# Patient Record
Sex: Female | Born: 1955
Health system: Southern US, Community
[De-identification: ages and names within clinical notes are randomized; demographics above are authoritative.]

---

## 2005-10-21 ENCOUNTER — Ambulatory Visit: Payer: Self-pay | Admitting: Family Medicine

## 2007-01-18 ENCOUNTER — Ambulatory Visit: Payer: Self-pay | Admitting: Internal Medicine

## 2010-12-16 ENCOUNTER — Ambulatory Visit: Payer: Self-pay

## 2015-01-23 ENCOUNTER — Telehealth: Payer: Self-pay | Admitting: Internal Medicine

## 2015-01-23 NOTE — Telephone Encounter (Signed)
Dr Derrel Nip will take Mrs. Kleeman on as a new pt.. Pt will call to schedule.Marland Kitchen

## 2015-12-28 DIAGNOSIS — H52203 Unspecified astigmatism, bilateral: Secondary | ICD-10-CM | POA: Diagnosis not present

## 2016-12-11 ENCOUNTER — Ambulatory Visit (INDEPENDENT_AMBULATORY_CARE_PROVIDER_SITE_OTHER)
Admission: EM | Admit: 2016-12-11 | Discharge: 2016-12-11 | Disposition: A | Discharge status: Home / Self Care | Payer: 59 | Source: Home / Self Care | Attending: Family Medicine | Admitting: Family Medicine

## 2016-12-11 ENCOUNTER — Ambulatory Visit (INDEPENDENT_AMBULATORY_CARE_PROVIDER_SITE_OTHER)
Admit: 2016-12-11 | Discharge: 2016-12-11 | Disposition: A | Discharge status: Home / Self Care | Payer: 59 | Attending: Family Medicine | Admitting: Family Medicine

## 2016-12-11 DIAGNOSIS — Z8249 Family history of ischemic heart disease and other diseases of the circulatory system: Secondary | ICD-10-CM

## 2016-12-11 DIAGNOSIS — Z87440 Personal history of urinary (tract) infections: Secondary | ICD-10-CM

## 2016-12-11 DIAGNOSIS — C240 Malignant neoplasm of extrahepatic bile duct: Secondary | ICD-10-CM | POA: Diagnosis not present

## 2016-12-11 DIAGNOSIS — K831 Obstruction of bile duct: Secondary | ICD-10-CM | POA: Diagnosis not present

## 2016-12-11 DIAGNOSIS — F17200 Nicotine dependence, unspecified, uncomplicated: Secondary | ICD-10-CM | POA: Insufficient documentation

## 2016-12-11 DIAGNOSIS — R1110 Vomiting, unspecified: Secondary | ICD-10-CM | POA: Diagnosis not present

## 2016-12-11 DIAGNOSIS — R110 Nausea: Secondary | ICD-10-CM

## 2016-12-11 DIAGNOSIS — R300 Dysuria: Secondary | ICD-10-CM

## 2016-12-11 DIAGNOSIS — R8290 Unspecified abnormal findings in urine: Secondary | ICD-10-CM

## 2016-12-11 DIAGNOSIS — Z79899 Other long term (current) drug therapy: Secondary | ICD-10-CM

## 2016-12-11 DIAGNOSIS — C249 Malignant neoplasm of biliary tract, unspecified: Secondary | ICD-10-CM

## 2016-12-11 DIAGNOSIS — R590 Localized enlarged lymph nodes: Secondary | ICD-10-CM

## 2016-12-11 DIAGNOSIS — Z808 Family history of malignant neoplasm of other organs or systems: Secondary | ICD-10-CM

## 2016-12-11 DIAGNOSIS — R1013 Epigastric pain: Secondary | ICD-10-CM

## 2016-12-11 DIAGNOSIS — R109 Unspecified abdominal pain: Secondary | ICD-10-CM | POA: Diagnosis not present

## 2016-12-11 DIAGNOSIS — Z833 Family history of diabetes mellitus: Secondary | ICD-10-CM | POA: Insufficient documentation

## 2016-12-11 DIAGNOSIS — Z882 Allergy status to sulfonamides status: Secondary | ICD-10-CM

## 2016-12-11 DIAGNOSIS — C221 Intrahepatic bile duct carcinoma: Secondary | ICD-10-CM

## 2016-12-11 DIAGNOSIS — R112 Nausea with vomiting, unspecified: Secondary | ICD-10-CM

## 2016-12-11 LAB — COMPREHENSIVE METABOLIC PANEL
ALBUMIN: 3.9 g/dL (ref 3.5–5.0)
ALK PHOS: 383 U/L — AB (ref 38–126)
ALT: 277 U/L — AB (ref 14–54)
AST: 189 U/L — AB (ref 15–41)
Anion gap: 8 (ref 5–15)
BUN: 11 mg/dL (ref 6–20)
CALCIUM: 9.1 mg/dL (ref 8.9–10.3)
CHLORIDE: 106 mmol/L (ref 101–111)
CO2: 25 mmol/L (ref 22–32)
CREATININE: 0.53 mg/dL (ref 0.44–1.00)
GFR calc Af Amer: >60 mL/min (ref >60)
GFR calc non Af Amer: >60 mL/min (ref >60)
Glucose, Bld: 94 mg/dL (ref 65–99)
Potassium: 4.6 mmol/L (ref 3.5–5.1)
SODIUM: 139 mmol/L (ref 135–145)
Total Bilirubin: 7.8 mg/dL — ABNORMAL HIGH (ref 0.3–1.2)
Total Protein: 7.6 g/dL (ref 6.5–8.1)

## 2016-12-11 LAB — LIPASE, BLOOD: LIPASE: 39 U/L (ref 11–51)

## 2016-12-11 LAB — CBC WITH DIFFERENTIAL/PLATELET
BASOS ABS: 0.2 10*3/uL — AB (ref 0–0.1)
Basophils Relative: 2 %
EOS ABS: 0.2 10*3/uL (ref 0–0.7)
Eosinophils Relative: 2 %
HCT: 38.2 % (ref 35.0–47.0)
HEMOGLOBIN: 13.1 g/dL (ref 12.0–16.0)
LYMPHS ABS: 2.8 10*3/uL (ref 1.0–3.6)
Lymphocytes Relative: 35 %
MCH: 31.7 pg (ref 26.0–34.0)
MCHC: 34.4 g/dL (ref 32.0–36.0)
MCV: 92.2 fL (ref 80.0–100.0)
MONO ABS: 0.7 10*3/uL (ref 0.2–0.9)
Monocytes Relative: 9 %
NEUTROS PCT: 52 %
Neutro Abs: 4 10*3/uL (ref 1.4–6.5)
PLATELETS: 317 10*3/uL (ref 150–440)
RBC: 4.15 MIL/uL (ref 3.80–5.20)
RDW: 15.1 % — ABNORMAL HIGH (ref 11.5–14.5)
WBC: 7.9 10*3/uL (ref 3.6–11.0)

## 2016-12-11 LAB — URINALYSIS, COMPLETE (UACMP) WITH MICROSCOPIC
Glucose, UA: NEGATIVE mg/dL
HGB URINE DIPSTICK: NEGATIVE
Ketones, ur: NEGATIVE mg/dL
Leukocytes, UA: NEGATIVE
NITRITE: NEGATIVE
PROTEIN: NEGATIVE mg/dL
SPECIFIC GRAVITY, URINE: 1.01 (ref 1.005–1.030)
pH: 6.5 (ref 5.0–8.0)

## 2016-12-11 LAB — GAMMA GT: GGT: 293 U/L — ABNORMAL HIGH (ref 7–50)

## 2016-12-11 MED ORDER — IOPAMIDOL (ISOVUE-300) INJECTION 61%
50.0000 mL | Freq: Once | INTRAVENOUS | Status: AC | PRN
  Administered 2016-12-11: 100 mL via INTRAVENOUS

## 2016-12-11 MED ORDER — IOPAMIDOL (ISOVUE-300) INJECTION 61%: 100.0000 mL | Freq: Once | INTRAVENOUS | Status: DC | PRN

## 2016-12-11 NOTE — ED Provider Notes (Addendum)
[facility]    CSN: [id number] Arrival date & time: 12/11/16  1856  History   Chief Complaint Chief Complaint  Patient presents with  . Recurrent UTI  . Abdominal Pain   HPI  61 year old female presents with the above complaints.  Patient reports a 4-day history of intermittent dysuria.  She reports that her urine is dark.  No reports of frequency or urgency.  No fever.  She has been taking some cranberry supplement without improvement.  She currently has no dysuria.  She states that she is most troubled by the dark color of her urine.  Additionally, patient has had ongoing GI issues for the past 2 weeks.  She reports epigastric pain and associated belching and burping.  She states that she has never had this before.  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  She states that she has had no improvement.  In addition to her symptoms above, when inquired about her eating she endorsed early satiety and associated nausea.  She has had one episode of emesis.  No changes in her diet.  No other associated symptoms.  No other complaints at this time.  PMH - Non per report. She is a current everyday smoker.  Surgical history - D&C.  OB History    No data available     Home Medications    Prior to Admission medications   Medication Sig Start Date End Date Taking? Authorizing Provider  dexlansoprazole (DEXILANT) 60 MG capsule Take 60 mg by mouth daily.   Yes [provider]    Family History Family History  Problem Relation Age of Onset  . Hypertension Mother   . Thyroid disease Mother   . Diabetes Mother   . Heart failure Mother   . Cancer Father        bone   Social History Social History   Tobacco Use  . Smoking status: Current Every Day Smoker  . Smokeless tobacco: Current User  Substance Use Topics  . Alcohol use: Yes  . Drug use: No    Allergies   Sulfa antibiotics   Review of Systems Review of  Systems  Constitutional: Negative for fever.  Gastrointestinal: Positive for abdominal pain.       Nausea, vomiting, belching, early satiety.  Genitourinary:       Intermittent dysuria.  Dark colored urine.  All other systems reviewed and are negative.  Physical Exam Triage Vital Signs ED Triage Vitals  Enc Vitals Group     BP 12/11/16 0856 129/81     Pulse Rate 12/11/16 0856 76     Resp 12/11/16 0856 16     Temp 12/11/16 0856 98 F (36.7 C)     Temp Source 12/11/16 0856 Oral     SpO2 12/11/16 0856 98 %     Weight 12/11/16 0854 124 lb (56.2 kg)     Height 12/11/16 0854 5' (1.524 m)     Head Circumference --      Peak Flow --      Pain Score 12/11/16 0853 5     Pain Loc --      Pain Edu? --      Excl. in [place]? --     Updated Vital Signs BP 129/81 (BP Location: Left Arm)   Pulse 76   Temp 98 F (36.7 C) (Oral)   Resp 16   Ht 5' (1.524 m)   Wt 124 lb (56.2 kg)  SpO2 98%   BMI 24.22 kg/m   Physical Exam  Constitutional: She is oriented to person, place, and time. She appears well-developed. No distress.  HENT:  Head: Normocephalic and atraumatic.  Nose: Nose normal.  Eyes: Pupils are equal, round, and reactive to light.  Scleral icterus noted.  Cardiovascular: Normal rate and regular rhythm.  No murmur heard. Pulmonary/Chest: Effort normal and breath sounds normal. She has no rales.  Abdominal: Soft. She exhibits no distension.  Tenderness to palpation in the epigastric region.  Also tenderness in the region directly above the umbilicus.  No palpable mass on exam.    Neurological: She is alert and oriented to person, place, and time. She exhibits normal muscle tone.  Skin: Skin is warm. No rash noted.  Psychiatric: She has a normal mood and affect. Her behavior is normal.  Vitals reviewed.  UC Treatments / Results  Labs (all labs ordered are listed, but only abnormal results are displayed) Labs Reviewed  URINALYSIS, COMPLETE (UACMP) WITH MICROSCOPIC -  Abnormal; Notable for the following components:      Result Value   Color, Urine AMBER (*)    Bilirubin Urine MODERATE (*)    Squamous Epithelial / LPF 0-5 (*)    Bacteria, UA RARE (*)    All other components within normal limits  CBC WITH DIFFERENTIAL/PLATELET - Abnormal; Notable for the following components:   RDW 15.1 (*)    Basophils Absolute 0.2 (*)    All other components within normal limits  COMPREHENSIVE METABOLIC PANEL - Abnormal; Notable for the following components:   AST 189 (*)    ALT 277 (*)    Alkaline Phosphatase 383 (*)    Total Bilirubin 7.8 (*)    All other components within normal limits  LIPASE, BLOOD  GAMMA GT    EKG  EKG Interpretation None       Radiology Ct Abdomen Pelvis W Contrast  Result Date: 12/11/2016 CLINICAL DATA:  Epigastric pain, vomiting and jaundice. EXAM: CT ABDOMEN AND PELVIS WITH CONTRAST TECHNIQUE: Multidetector CT imaging of the abdomen and pelvis was performed using the standard protocol following bolus administration of intravenous contrast. CONTRAST:  148m ISOVUE-300 IOPAMIDOL (ISOVUE-300) INJECTION 61% COMPARISON:  None. FINDINGS: Lower chest: The lung bases are clear of acute process. No pleural effusion or pulmonary lesions. The heart is normal in size. No pericardial effusion. The distal esophagus and aorta are unremarkable. Hepatobiliary: Marked intrahepatic biliary dilatation within obstructing enhancing mass involving the common hepatic duct most consistent with biliary neoplasm/Klatskin tumor. The common bile duct is normal. The gallbladder is unremarkable. No hepatic metastatic lesions. Pancreas: No mass, inflammation or ductal dilatation. Spleen: Normal size.  No focal lesions. Adrenals/Urinary Tract: The adrenal glands kidneys are normal. The bladder is unremarkable. Stomach/Bowel: The stomach, duodenum, small bowel and colon are unremarkable. Vascular/Lymphatic: Borderline enlarged upper abdominal lymph nodes. There is a 9 mm  node in the celiac axis region, a 10 mm node in the periportal region on image number 23 and a 7.5 mm node near the portal vein on image number 20. The portal and hepatic veins are patent. The aorta and branch vessels are patent. Reproductive: The uterus and ovaries are normal. Other: No pelvic mass or adenopathy. No free pelvic fluid collections. No inguinal mass or adenopathy. No abdominal wall hernia or subcutaneous lesions. Musculoskeletal: No significant bony findings. IMPRESSION: 1. 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 2.  Normal common bile duct and normal pancreas. 3. Borderline enlarged periportal lymph nodes suspicious for metastatic disease. 4. No findings suspicious for hepatic metastatic disease. 5. No other significant findings in the abdomen/pelvis. These results were called by telephone at the time of interpretation on 12/11/2016 at 11:48 am to Dr. Thersa Salt , who verbally acknowledged these results. Electronically Signed   By: Marijo Sanes M.D.   On: 12/11/2016 11:48    Procedures Procedures (including critical care time)  Medications Ordered in UC Medications - No data to display   Initial Impression / Assessment and Plan / UC Course  I have reviewed the triage vital signs and the nursing notes.  Pertinent labs & imaging results that were available during my care of the patient were reviewed by me and considered in my medical decision making (see chart for details).    60 year old female presents with epigastric pain, and reports of intermittent dysuria.  Urinalysis revealed elevated bilirubin.  Physical exam was notable for epigastric tenderness and jaundice.  Laboratory studies revealed elevated alk phos, AST and ALT as well as hyperbilirubinemia.  Laboratory studies and physical exam concerning for underlying malignancy.  CT scan of the abdomen  and pelvis was done and revealed an infiltrating biliary tumor with intrahepatic biliary dilatation.  Borderline periportal lymph node suspicious for metastatic disease.  I had a lengthy discussion with the patient throughout her visit here.  The patient will be going to see oncology.  I am trying to coordinate this at this time.  A total of 45  minutes were spent face-to-face with the patient during this encounter and over half of that time was spent discussing her clinical picture, laboratory findings, imaging, diagnosis, and treatment options.   Final Clinical Impressions(s) / UC Diagnoses   Final diagnoses:  Cancer of biliary tract [facility])    ED Discharge Orders    None     Controlled Substance Prescriptions [place] Controlled Substance Registry consulted? Not Applicable   Coral Spikes, DO 12/11/16 [place], [place], DO 12/11/16 1244

## 2016-12-11 NOTE — Discharge Instructions (Signed)
Best of luck.  Either I will call or Dr. Gary Fleet office will call.  Dr. Lacinda Axon

## 2016-12-11 NOTE — ED Triage Notes (Signed)
Onset 4 days pt has UTI Sxs and taken cranberry vitamins and has abdominal soreness and nausea onset 2 weeks but getting worst from past 2 days.

## 2016-12-12 ENCOUNTER — Inpatient Hospital Stay: Payer: 59

## 2016-12-12 ENCOUNTER — Encounter: Payer: Self-pay | Admitting: Oncology

## 2016-12-12 ENCOUNTER — Inpatient Hospital Stay
Admission: EM | Admit: 2016-12-12 | Discharge: 2016-12-14 | DRG: 445 | Disposition: A | Discharge status: Home / Self Care | Payer: 59 | Attending: Internal Medicine | Admitting: Internal Medicine

## 2016-12-12 ENCOUNTER — Other Ambulatory Visit: Payer: Self-pay

## 2016-12-12 ENCOUNTER — Inpatient Hospital Stay: Payer: 59 | Attending: Oncology | Admitting: Oncology

## 2016-12-12 ENCOUNTER — Telehealth: Payer: Self-pay

## 2016-12-12 ENCOUNTER — Encounter: Payer: Self-pay | Admitting: Emergency Medicine

## 2016-12-12 VITALS — BP 150/95 | HR 97 | Temp 98.9°F | Wt 120.9 lb

## 2016-12-12 DIAGNOSIS — K831 Obstruction of bile duct: Secondary | ICD-10-CM | POA: Diagnosis not present

## 2016-12-12 DIAGNOSIS — D134 Benign neoplasm of liver: Secondary | ICD-10-CM | POA: Insufficient documentation

## 2016-12-12 DIAGNOSIS — F419 Anxiety disorder, unspecified: Secondary | ICD-10-CM | POA: Insufficient documentation

## 2016-12-12 DIAGNOSIS — C240 Malignant neoplasm of extrahepatic bile duct: Secondary | ICD-10-CM | POA: Diagnosis present

## 2016-12-12 DIAGNOSIS — R17 Unspecified jaundice: Secondary | ICD-10-CM | POA: Insufficient documentation

## 2016-12-12 DIAGNOSIS — Z79899 Other long term (current) drug therapy: Secondary | ICD-10-CM | POA: Diagnosis not present

## 2016-12-12 DIAGNOSIS — Z808 Family history of malignant neoplasm of other organs or systems: Secondary | ICD-10-CM | POA: Insufficient documentation

## 2016-12-12 DIAGNOSIS — Z7189 Other specified counseling: Secondary | ICD-10-CM | POA: Insufficient documentation

## 2016-12-12 DIAGNOSIS — R1013 Epigastric pain: Secondary | ICD-10-CM | POA: Diagnosis not present

## 2016-12-12 DIAGNOSIS — R590 Localized enlarged lymph nodes: Secondary | ICD-10-CM | POA: Diagnosis not present

## 2016-12-12 DIAGNOSIS — R300 Dysuria: Secondary | ICD-10-CM | POA: Insufficient documentation

## 2016-12-12 DIAGNOSIS — F17200 Nicotine dependence, unspecified, uncomplicated: Secondary | ICD-10-CM | POA: Diagnosis present

## 2016-12-12 DIAGNOSIS — R319 Hematuria, unspecified: Secondary | ICD-10-CM | POA: Diagnosis not present

## 2016-12-12 DIAGNOSIS — Z882 Allergy status to sulfonamides status: Secondary | ICD-10-CM

## 2016-12-12 DIAGNOSIS — R112 Nausea with vomiting, unspecified: Secondary | ICD-10-CM | POA: Insufficient documentation

## 2016-12-12 DIAGNOSIS — K828 Other specified diseases of gallbladder: Secondary | ICD-10-CM

## 2016-12-12 DIAGNOSIS — F17210 Nicotine dependence, cigarettes, uncomplicated: Secondary | ICD-10-CM | POA: Insufficient documentation

## 2016-12-12 DIAGNOSIS — K838 Other specified diseases of biliary tract: Secondary | ICD-10-CM | POA: Diagnosis not present

## 2016-12-12 DIAGNOSIS — R748 Abnormal levels of other serum enzymes: Secondary | ICD-10-CM | POA: Diagnosis not present

## 2016-12-12 HISTORY — DX: Obstruction of bile duct: K83.1

## 2016-12-12 LAB — COMPREHENSIVE METABOLIC PANEL
ALBUMIN: 4.2 g/dL (ref 3.5–5.0)
ALT: 266 U/L — ABNORMAL HIGH (ref 14–54)
ALT: 278 U/L — ABNORMAL HIGH (ref 14–54)
AST: 189 U/L — ABNORMAL HIGH (ref 15–41)
AST: 214 U/L — AB (ref 15–41)
Albumin: 4.1 g/dL (ref 3.5–5.0)
Alkaline Phosphatase: 374 U/L — ABNORMAL HIGH (ref 38–126)
Alkaline Phosphatase: 398 U/L — ABNORMAL HIGH (ref 38–126)
Anion gap: 11 (ref 5–15)
Anion gap: 11 (ref 5–15)
BUN: 10 mg/dL (ref 6–20)
BUN: 9 mg/dL (ref 6–20)
CHLORIDE: 102 mmol/L (ref 101–111)
CO2: 24 mmol/L (ref 22–32)
CO2: 24 mmol/L (ref 22–32)
Calcium: 9.1 mg/dL (ref 8.9–10.3)
Calcium: 9.8 mg/dL (ref 8.9–10.3)
Chloride: 102 mmol/L (ref 101–111)
Creatinine, Ser: 0.44 mg/dL (ref 0.44–1.00)
Creatinine, Ser: 0.38 mg/dL — ABNORMAL LOW (ref 0.44–1.00)
GFR calc Af Amer: >60 mL/min (ref >60)
GFR calc Af Amer: >60 mL/min (ref >60)
GFR calc non Af Amer: >60 mL/min (ref >60)
GFR calc non Af Amer: >60 mL/min (ref >60)
GLUCOSE: 103 mg/dL — AB (ref 65–99)
Glucose, Bld: 99 mg/dL (ref 65–99)
POTASSIUM: 4.2 mmol/L (ref 3.5–5.1)
Potassium: 3.9 mmol/L (ref 3.5–5.1)
SODIUM: 137 mmol/L (ref 135–145)
Sodium: 137 mmol/L (ref 135–145)
Total Bilirubin: 10.7 mg/dL — ABNORMAL HIGH (ref 0.3–1.2)
Total Bilirubin: 11.1 mg/dL — ABNORMAL HIGH (ref 0.3–1.2)
Total Protein: 7.9 g/dL (ref 6.5–8.1)
Total Protein: 8 g/dL (ref 6.5–8.1)

## 2016-12-12 LAB — CBC WITH DIFFERENTIAL/PLATELET
Basophils Absolute: 0.1 10*3/uL (ref 0–0.1)
Basophils Relative: 1 %
Eosinophils Absolute: 0.1 10*3/uL (ref 0–0.7)
Eosinophils Relative: 1 %
HCT: 40 % (ref 35.0–47.0)
Hemoglobin: 13.8 g/dL (ref 12.0–16.0)
Lymphocytes Relative: 28 %
Lymphs Abs: 2.7 10*3/uL (ref 1.0–3.6)
MCH: 31.8 pg (ref 26.0–34.0)
MCHC: 34.6 g/dL (ref 32.0–36.0)
MCV: 92.1 fL (ref 80.0–100.0)
Monocytes Absolute: 0.8 10*3/uL (ref 0.2–0.9)
Monocytes Relative: 8 %
Neutro Abs: 6 10*3/uL (ref 1.4–6.5)
Neutrophils Relative %: 62 %
Platelets: 334 10*3/uL (ref 150–440)
RBC: 4.34 MIL/uL (ref 3.80–5.20)
RDW: 15.3 % — ABNORMAL HIGH (ref 11.5–14.5)
WBC: 9.8 10*3/uL (ref 3.6–11.0)

## 2016-12-12 LAB — URINALYSIS, COMPLETE (UACMP) WITH MICROSCOPIC
Glucose, UA: NEGATIVE mg/dL
Hgb urine dipstick: NEGATIVE
Ketones, ur: 5 mg/dL — AB
Leukocytes, UA: NEGATIVE
Nitrite: NEGATIVE
Protein, ur: NEGATIVE mg/dL
Specific Gravity, Urine: 1.006 (ref 1.005–1.030)
pH: 6 (ref 5.0–8.0)

## 2016-12-12 LAB — PROTIME-INR
INR: 0.83
INR: 0.8
Prothrombin Time: 11.3 seconds — ABNORMAL LOW (ref 11.4–15.2)
Prothrombin Time: 11 seconds — ABNORMAL LOW (ref 11.4–15.2)

## 2016-12-12 LAB — LIPASE, BLOOD: Lipase: 30 U/L (ref 11–51)

## 2016-12-12 LAB — APTT
APTT: 29 s (ref 24–36)
aPTT: 28 seconds (ref 24–36)

## 2016-12-12 MED ORDER — ONDANSETRON HCL 4 MG PO TABS
4.0000 mg | ORAL_TABLET | Freq: Four times a day (QID) | ORAL | Status: DC | PRN
  Administered 2016-12-14: 4 mg via ORAL
  Filled 2016-12-12: qty 1

## 2016-12-12 MED ORDER — ACETAMINOPHEN 325 MG PO TABS: 650.0000 mg | ORAL_TABLET | Freq: Four times a day (QID) | ORAL | Status: DC | PRN

## 2016-12-12 MED ORDER — DOCUSATE SODIUM 100 MG PO CAPS
100.0000 mg | ORAL_CAPSULE | Freq: Two times a day (BID) | ORAL | Status: DC
  Administered 2016-12-12 – 2016-12-14 (×4): 100 mg via ORAL
  Filled 2016-12-12 (×4): qty 1

## 2016-12-12 MED ORDER — SODIUM CHLORIDE 0.9 % IV SOLN
INTRAVENOUS | Status: DC
  Administered 2016-12-12 – 2016-12-13 (×3): via INTRAVENOUS

## 2016-12-12 MED ORDER — BISACODYL 5 MG PO TBEC: 5.0000 mg | DELAYED_RELEASE_TABLET | Freq: Every day | ORAL | Status: DC | PRN

## 2016-12-12 MED ORDER — HEPARIN SODIUM (PORCINE) 5000 UNIT/ML IJ SOLN
5000.0000 [IU] | Freq: Three times a day (TID) | INTRAMUSCULAR | Status: DC
  Administered 2016-12-13 – 2016-12-14 (×3): 5000 [IU] via SUBCUTANEOUS
  Filled 2016-12-12 (×3): qty 1

## 2016-12-12 MED ORDER — ONDANSETRON HCL 4 MG/2ML IJ SOLN
4.0000 mg | Freq: Four times a day (QID) | INTRAMUSCULAR | Status: DC | PRN
  Administered 2016-12-12 – 2016-12-13 (×2): 4 mg via INTRAVENOUS
  Filled 2016-12-12 (×2): qty 2

## 2016-12-12 MED ORDER — HYDROCODONE-ACETAMINOPHEN 5-325 MG PO TABS
1.0000 | ORAL_TABLET | ORAL | Status: DC | PRN
  Administered 2016-12-13 (×2): 1 via ORAL
  Administered 2016-12-13: 2 via ORAL
  Administered 2016-12-13: 1 via ORAL
  Administered 2016-12-14: 11:00:00 2 via ORAL
  Filled 2016-12-12: qty 1
  Filled 2016-12-12: qty 2
  Filled 2016-12-12: qty 1
  Filled 2016-12-12: qty 2
  Filled 2016-12-12: qty 1

## 2016-12-12 MED ORDER — TRAZODONE HCL 50 MG PO TABS: 25.0000 mg | ORAL_TABLET | Freq: Every evening | ORAL | Status: DC | PRN

## 2016-12-12 MED ORDER — ACETAMINOPHEN 325 MG RE SUPP
650.0000 mg | Freq: Four times a day (QID) | RECTAL | Status: DC | PRN
  Filled 2016-12-12: qty 2

## 2016-12-12 MED ORDER — PIPERACILLIN-TAZOBACTAM 3.375 G IVPB
3.3750 g | INTRAVENOUS | Status: AC
  Administered 2016-12-13: 10:00:00 3.375 g via INTRAVENOUS
  Filled 2016-12-12: qty 50

## 2016-12-12 NOTE — H&P (Signed)
Sierra Elliott at [place] NAME: Sierra Elliott    MR#:  [id number]  DATE OF BIRTH:  [birth year]  DATE OF ADMISSION:  12/12/2016  PRIMARY CARE PHYSICIAN: Patient, No Pcp Per   REQUESTING/REFERRING PHYSICIAN: Dr. Corinna Elliott  CHIEF COMPLAINT: Worsening jaundice   Chief Complaint  Patient presents with  . Other    See Triage Notes    HISTORY OF PRESENT ILLNESS:  Sierra Elliott  is a 61 y.o. female with recent diagnosis of Klatskin's tumor has been having worsening jaundice.  HEENT initial bilirubin 1.8 and it went up to 10.7 and 24 hours.  So the case was discussed with interventional radiologist by Dr. Grayland Ormond who determined that patient needs percutaneous drainage with biliary duct brushing, patient already scheduled to have percutaneous drainage tomorrow by interventional radiologist  we are admitting patient to medical service.  Patient denies abdominal pain, nausea, vomiting.  No past medical history.  PAST MEDICAL HISTORY:  History reviewed. No pertinent past medical history.  PAST SURGICAL HISTOIRY:  History reviewed. No pertinent surgical history.  SOCIAL HISTORY:   Social History   Tobacco Use  . Smoking status: Current Every Day Smoker  . Smokeless tobacco: Current User  Substance Use Topics  . Alcohol use: Yes    FAMILY HISTORY:   Family History  Problem Relation Age of Onset  . Hypertension Mother   . Thyroid disease Mother   . Diabetes Mother   . Heart failure Mother   . Cancer Father        bone    DRUG ALLERGIES:   Allergies  Allergen Reactions  . Sulfa Antibiotics     REVIEW OF SYSTEMS:  CONSTITUTIONAL: No fever, fatigue or weakness.  EYES: No blurred or double vision. jaundice EARS, NOSE, AND THROAT: No tinnitus or ear pain.  RESPIRATORY: No cough, shortness of breath, wheezing or hemoptysis.  CARDIOVASCULAR: No chest pain, orthopnea, edema.  GASTROINTESTINAL: No nausea, vomiting, diarrhea  or abdominal pain.  GENITOURINARY: No dysuria, hematuria.  ENDOCRINE: No polyuria, nocturia,  HEMATOLOGY: No anemia, easy bruising or bleeding SKIN: No rash or lesion. MUSCULOSKELETAL: No joint pain or arthritis.   NEUROLOGIC: No tingling, numbness, weakness.  PSYCHIATRY: No anxiety or depression.   MEDICATIONS AT HOME:   Prior to Admission medications   Medication Sig Start Date End Date Taking? Authorizing Provider  Ascorbic Acid (VITAMIN C) 1000 MG tablet Take 1,000 mg by mouth daily.    [provider]  CRANBERRY FRUIT PO Take by mouth.    [provider]      VITAL SIGNS:  Blood pressure 129/83, pulse 86, temperature 98.1 F (36.7 C), temperature source Oral, resp. rate 16, height 5' (1.524 m), weight 54.4 kg (120 lb), SpO2 98 %.  PHYSICAL EXAMINATION:  GENERAL:  61 y.o.-year-old patient lying in the bed with no acute distress.  EYES: Pupils equal, round, reactive to light and accommodation. Has  scleral icterus. Extraocular muscles intact.  HEENT: Head atraumatic, normocephalic. Oropharynx and nasopharynx clear.  NECK:  Supple, no jugular venous distention. No thyroid enlargement, no tenderness.  LUNGS: Normal breath sounds bilaterally, no wheezing, rales,rhonchi or crepitation. No use of accessory muscles of respiration.  CARDIOVASCULAR: S1, S2 normal. No murmurs, rubs, or gallops.  ABDOMEN: Soft, nontender, nondistended. Bowel sounds present. No organomegaly or mass.  EXTREMITIES: No pedal edema, cyanosis, or clubbing.  NEUROLOGIC: Cranial nerves II through XII are intact. Muscle strength 5/5 in all extremities. Sensation intact. Gait not  checked.  PSYCHIATRIC: The patient is alert and oriented x 3.  SKIN: No obvious rash, lesion, or ulcer.   LABORATORY PANEL:   CBC Recent Labs  Lab 12/12/16 1649  WBC 9.8  HGB 13.8  HCT 40.0  PLT 334    ------------------------------------------------------------------------------------------------------------------  Chemistries  Recent Labs  Lab 12/12/16 1649  NA 137  K 4.2  CL 102  CO2 24  GLUCOSE 103*  BUN 9  CREATININE 0.38*  CALCIUM 9.8  AST 214*  ALT 278*  ALKPHOS 398*  BILITOT 11.1*   ------------------------------------------------------------------------------------------------------------------  Cardiac Enzymes No results for input(s): TROPONINI in the last 168 hours. ------------------------------------------------------------------------------------------------------------------  RADIOLOGY:  Ct Abdomen Pelvis W Contrast  Result Date: 12/11/2016 CLINICAL DATA:  Epigastric pain, vomiting and jaundice. EXAM: CT ABDOMEN AND PELVIS WITH CONTRAST TECHNIQUE: Multidetector CT imaging of the abdomen and pelvis was performed using the standard protocol following bolus administration of intravenous contrast. CONTRAST:  163m ISOVUE-300 IOPAMIDOL (ISOVUE-300) INJECTION 61% COMPARISON:  None. FINDINGS: Lower chest: The lung bases are clear of acute process. No pleural effusion or pulmonary lesions. The heart is normal in size. No pericardial effusion. The distal esophagus and aorta are unremarkable. Hepatobiliary: Marked intrahepatic biliary dilatation within obstructing enhancing mass involving the common hepatic duct most consistent with biliary neoplasm/Klatskin tumor. The common bile duct is normal. The gallbladder is unremarkable. No hepatic metastatic lesions. Pancreas: No mass, inflammation or ductal dilatation. Spleen: Normal size.  No focal lesions. Adrenals/Urinary Tract: The adrenal glands kidneys are normal. The bladder is unremarkable. Stomach/Bowel: The stomach, duodenum, small bowel and colon are unremarkable. Vascular/Lymphatic: Borderline enlarged upper abdominal lymph nodes. There is a 9 mm node in the celiac axis region, a 10 mm node in the periportal region on  image number 23 and a 7.5 mm node near the portal vein on image number 20. The portal and hepatic veins are patent. The aorta and branch vessels are patent. Reproductive: The uterus and ovaries are normal. Other: No pelvic mass or adenopathy. No free pelvic fluid collections. No inguinal mass or adenopathy. No abdominal wall hernia or subcutaneous lesions. Musculoskeletal: No significant bony findings. IMPRESSION: 1. 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 2. Normal common bile duct and normal pancreas. 3. Borderline enlarged periportal lymph nodes suspicious for metastatic disease. 4. No findings suspicious for hepatic metastatic disease. 5. No other significant findings in the abdomen/pelvis. These results were called by telephone at the time of interpretation on 12/11/2016 at 11:48 am to Dr. JThersa Salt, who verbally acknowledged these results. Electronically Signed   By: PMarijo SanesM.D.   On: 12/11/2016 11:48    EKG:  No orders found for this or any previous visit.  IMPRESSION AND PLAN:   Klatskin tumor with worsening jaundice, patient bilirubin up to 11 today and increased from 7.8-11 n24 hours, patient is admitted to medical service to have interventional radiology to draw put in the PTC drain tomorrow, monitor LFTs and clinical improvement after the drain for another 24 hours.  Dr. FGrayland Ormondis following  for chemotherapy and also further treatment di cussed with family.   All the records are reviewed and case discussed with ED provider. Management plans discussed with the patient, family and they are in agreement.  CODE STATUS: full  TOTAL TIME TAKING CARE OF THIS PATIENT: 58 minutes.    SEpifanio LeschesM.D on 12/12/2016 at 5:56 PM  Between 7am to 6pm -  Pager - [phone]  After 6pm go to [email or web address] - password [email or web address] [facility]  Office  [phone]  CC: Primary care physician; Patient, No Pcp Per  Note: This dictation was prepared with Dragon dictation along with smaller phrase technology. Any transcriptional errors that result from this process are unintentional.

## 2016-12-12 NOTE — Consult Note (Signed)
Chief Complaint: Patient was seen in consultation today for biliary obstruction, hyperbilirubinemia   Referring Physician(s): Dr. Delight Hoh  Supervising Physician: Markus Daft  Patient Status: [facility] - In-pt  History of Present Illness: Sierra Elliott is a 61 y.o. female with no significant past medical history who presents to [facility] ED with hyperbilirubinemia which has worsened in the past 24 hrs.  Patient was initially evaluated at [facility] yesterday where her bilirubin was 7.8.  CT Abdomen Pelvis showed: 1. 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 2. Normal common bile duct and normal pancreas. 3. Borderline enlarged periportal lymph nodes suspicious for metastatic disease. 4. No findings suspicious for hepatic metastatic disease. 5. No other significant findings in the abdomen/pelvis.  She was referred to an oncologist where she was found to have scleral icterus and lab work showed further elevation in her bilirubin to 10.7. She was sent to the ED for management of her biliary obstruction.   IR consulted for biliary drain placement.  Case reviewed by Dr. Anselm Pancoast who feels patient is appropriate for procedure.  The patient is being admitted overnight.   History reviewed. No pertinent past medical history.  History reviewed. No pertinent surgical history.  Allergies: Sulfa antibiotics  Medications: Prior to Admission medications   Medication Sig Start Date End Date Taking? Authorizing Provider  Ascorbic Acid (VITAMIN C) 1000 MG tablet Take 1,000 mg by mouth daily.    [provider]  CRANBERRY FRUIT PO Take by mouth.    [provider]     Family History  Problem Relation Age of Onset  . Hypertension Mother   . Thyroid disease Mother   . Diabetes Mother   . Heart failure Mother   . Cancer Father     bone    Social History   Socioeconomic History  . Marital status: Married    Spouse name: None  . Number of children: None  . Years of education: None  . Highest education level: None  Social Needs  . Financial resource strain: None  . Food insecurity - worry: None  . Food insecurity - inability: None  . Transportation needs - medical: None  . Transportation needs - non-medical: None  Occupational History  . None  Tobacco Use  . Smoking status: Current Every Day Smoker  . Smokeless tobacco: Current User  Substance and Sexual Activity  . Alcohol use: Yes  . Drug use: No  . Sexual activity: None  Other Topics Concern  . None  Social History Narrative  . None    Review of Systems  Constitutional: Negative for fatigue and fever.  Respiratory: Negative for cough and shortness of breath.   Cardiovascular: Negative for chest pain.  Gastrointestinal: Positive for abdominal pain and nausea.  Psychiatric/Behavioral: Negative for behavioral problems and confusion.    Vital Signs: BP 129/83 (BP Location: Right Arm)   Pulse 86   Temp 98.1 F (36.7 C) (Oral)   Resp 16   Ht 5' (1.524 m)   Wt 120 lb (54.4 kg)   SpO2 98%   BMI 23.44 kg/m   Physical Exam  Constitutional: She is oriented to person, place, and time. She appears well-developed.  Cardiovascular: Normal rate, regular rhythm and normal heart sounds.  Pulmonary/Chest: Effort normal and breath sounds normal. No respiratory distress.  Abdominal: Soft. She exhibits no mass. There is no tenderness.  Neurological: She is alert  and oriented to person, place, and time.  Skin: Skin is warm and dry.  Psychiatric: She has a normal mood and affect. Her behavior is normal. Judgment and thought content normal.  Nursing note and vitals reviewed.   Imaging: Ct Abdomen Pelvis W Contrast  Result Date: 12/11/2016 CLINICAL DATA:  Epigastric pain, vomiting and jaundice. EXAM: CT ABDOMEN AND PELVIS WITH CONTRAST TECHNIQUE:  Multidetector CT imaging of the abdomen and pelvis was performed using the standard protocol following bolus administration of intravenous contrast. CONTRAST:  141mL ISOVUE-300 IOPAMIDOL (ISOVUE-300) INJECTION 61% COMPARISON:  None. FINDINGS: Lower chest: The lung bases are clear of acute process. No pleural effusion or pulmonary lesions. The heart is normal in size. No pericardial effusion. The distal esophagus and aorta are unremarkable. Hepatobiliary: Marked intrahepatic biliary dilatation within obstructing enhancing mass involving the common hepatic duct most consistent with biliary neoplasm/Klatskin tumor. The common bile duct is normal. The gallbladder is unremarkable. No hepatic metastatic lesions. Pancreas: No mass, inflammation or ductal dilatation. Spleen: Normal size.  No focal lesions. Adrenals/Urinary Tract: The adrenal glands kidneys are normal. The bladder is unremarkable. Stomach/Bowel: The stomach, duodenum, small bowel and colon are unremarkable. Vascular/Lymphatic: Borderline enlarged upper abdominal lymph nodes. There is a 9 mm node in the celiac axis region, a 10 mm node in the periportal region on image number 23 and a 7.5 mm node near the portal vein on image number 20. The portal and hepatic veins are patent. The aorta and branch vessels are patent. Reproductive: The uterus and ovaries are normal. Other: No pelvic mass or adenopathy. No free pelvic fluid collections. No inguinal mass or adenopathy. No abdominal wall hernia or subcutaneous lesions. Musculoskeletal: No significant bony findings. IMPRESSION: 1. 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 2. Normal common bile duct and normal pancreas. 3. Borderline enlarged periportal lymph nodes suspicious for metastatic disease. 4. No findings suspicious for hepatic metastatic disease. 5. No other significant  findings in the abdomen/pelvis. These results were called by telephone at the time of interpretation on 12/11/2016 at 11:48 am to Dr. Thersa Salt , who verbally acknowledged these results. Electronically Signed   By: Marijo Sanes M.D.   On: 12/11/2016 11:48    Labs:  CBC: Recent Labs    12/11/16 0936  WBC 7.9  HGB 13.1  HCT 38.2  PLT 317    COAGS: Recent Labs    12/12/16 1234  INR 0.83  APTT 29    BMP: Recent Labs    12/11/16 0936 12/12/16 1234  NA 139 137  K 4.6 3.9  CL 106 102  CO2 25 24  GLUCOSE 94 99  BUN 11 10  CALCIUM 9.1 9.1  CREATININE 0.53 0.44  GFRNONAA >60 >60  GFRAA >60 >60    LIVER FUNCTION TESTS: Recent Labs    12/11/16 0936 12/12/16 1234  BILITOT 7.8* 10.7*  AST 189* 189*  ALT 277* 266*  ALKPHOS 383* 374*  PROT 7.6 7.9  ALBUMIN 3.9 4.1    TUMOR MARKERS: No results for input(s): AFPTM, CEA, CA199, CHROMGRNA in the last 8760 hours.  Assessment and Plan: Suspected cholangiocarcinoma, biliary obstruction, hyperbilirubinemia. Patient presents with biliary obstruction and concern for cholestasis.  IR consulted for transhepatic cholangiogram and internal/external drain placement at the request of Dr. Grayland Ormond. Case reviewed by Dr. Anselm Pancoast who approves patient for procedure.  Plan is for patient to be admitted to hospitalist service.  Patient  will be NPO after midnight. Anticipate procedure tomorrow.  Her INR in 0.83 today. No blood thinner overnight.  Risks and benefits discussed with the patient including, but not limited to bleeding, infection, gallbladder perforation, bile leak, sepsis or even death. All of the patient's questions were answered, patient is agreeable to proceed. Consent signed and in chart.  Thank you for this interesting consult.  I greatly enjoyed meeting Sierra Elliott and look forward to participating in their care.  A copy of this report was sent to the requesting provider on this date.  Electronically  Signed: Docia Barrier, PA 12/12/2016, 3:55 PM   I spent a total of 40 Minutes    in face to face in clinical consultation, greater than 50% of which was counseling/coordinating care for biliary obstruction.

## 2016-12-12 NOTE — ED Notes (Signed)
This RN spoke with Elenore Rota, Agricultural consultant. Per Elenore Rota, pt is to be evaluated by ER physician and hospitalist for jaundice and a drain placed to bile duct, and increasing bilirubin enzymes.

## 2016-12-12 NOTE — ED Notes (Signed)
Labs drawn earlier today at [facility]. Visible in Epic.

## 2016-12-12 NOTE — Patient Instructions (Signed)
Your nurse navigator is Casimiro Lienhard. [phone]. 

## 2016-12-12 NOTE — ED Provider Notes (Signed)
[facility] Emergency Department Provider Note  ____________________________________________  Time seen: Approximately 5:03 PM  I have reviewed the triage vital signs and the nursing notes.   HISTORY  Chief Complaint Other (See Triage Notes)    HPI Sierra Elliott is a 61 y.o. female who presents the emergency department from [place] for admission and percutaneous drain placement.  Patient saw her primary care yesterday complaining of some mild abdominal pain, dysuria, dark urine.  Patient was evaluated with basic labs and urinalysis.  Patient had elevated LFTs, hyperbilirubinemia, with no significant urinalysis indication of UTI.  Due to patient's abdominal pain with elevated LFTs, imaging was obtained.  This reveals Klatskin's tumor on imaging.  Patient was sent to [facility] urgent care to see oncology this morning.  Patient's bilirubin jumped from 7.8 up to 10.7.  Patient was sent to the emergency department for admission for percutaneous drain placement.  Prior to my entering the room, interventional radiology had seen and evaluated the patient with plans to place a drain in the morning.  Patient endorsing some mild abdominal pain with nausea.  Patient denies any headache, visual changes, chest pain, shortness of breath, emesis, diarrhea or constipation.  Patient has had no medications prior to arrival.  Patient declines pain medications at this time but would like something for nausea.  History reviewed. No pertinent past medical history.  Patient Active Problem List   Diagnosis Date Noted  . Klatskin's tumor ([place]) 12/12/2016  . Goals of care, counseling/discussion 12/12/2016  . Obstructive jaundice due to cancer [facility]) 12/12/2016    History reviewed. No pertinent surgical history.  Prior to Admission medications   Medication Sig Start Date End Date Taking? Authorizing Provider  Ascorbic Acid (VITAMIN C) 1000 MG tablet Take 1,000 mg by mouth  daily.    [provider]  CRANBERRY FRUIT PO Take by mouth.    [provider]    Allergies Sulfa antibiotics  Family History  Problem Relation Age of Onset  . Hypertension Mother   . Thyroid disease Mother   . Diabetes Mother   . Heart failure Mother   . Cancer Father        bone    Social History Social History   Tobacco Use  . Smoking status: Current Every Day Smoker  . Smokeless tobacco: Current User  Substance Use Topics  . Alcohol use: Yes  . Drug use: No     Review of Systems  Constitutional: No fever/chills Eyes: No visual changes.  Cardiovascular: no chest pain. Respiratory: no cough. No SOB. Gastrointestinal: Mild right upper quadrant abdominal pain.  Positive nausea, no vomiting.  No diarrhea.  No constipation. Genitourinary: Negative for dysuria. No hematuria Musculoskeletal: Negative for musculoskeletal pain. Skin: Negative for rash, abrasions, lacerations, ecchymosis. Neurological: Negative for headaches, focal weakness or numbness. 10-point ROS otherwise negative.  ____________________________________________   PHYSICAL EXAM:  VITAL SIGNS: ED Triage Vitals  Enc Vitals Group     BP 12/12/16 1451 129/83     Pulse Rate 12/12/16 1451 86     Resp 12/12/16 1451 16     Temp 12/12/16 1451 98.1 F (36.7 C)     Temp Source 12/12/16 1451 Oral     SpO2 12/12/16 1451 98 %     Weight 12/12/16 1452 120 lb (54.4 kg)     Height 12/12/16 1452 5' (1.524 m)     Head Circumference --      Peak Flow --  Pain Score 12/12/16 1451 5     Pain Loc --      Pain Edu? --      Excl. in [place]? --      Constitutional: Alert and oriented. Well appearing and in no acute distress. Eyes: Conjunctivae are slightly icteric. PERRL. EOMI. Head: Atraumatic. ENT:      Ears:       Nose: No congestion/rhinnorhea.      Mouth/Throat: Mucous membranes are moist.  Neck: No stridor.    Cardiovascular: Normal rate, regular rhythm. Normal S1 and S2.  Good  peripheral circulation. Respiratory: Normal respiratory effort without tachypnea or retractions. Lungs CTAB. Good air entry to the bases with no decreased or absent breath sounds. Gastrointestinal: Bowel sounds 4 quadrants. Soft to palpation.  Patient is tender to palpation right upper quadrant and around the umbilicus.  No guarding or rigidity. No palpable masses. No distention. No CVA tenderness. Musculoskeletal: Full range of motion to all extremities. No gross deformities appreciated. Neurologic:  Normal speech and language. No gross focal neurologic deficits are appreciated.  Skin:  Skin is warm, dry and intact. No rash noted. Mild jaundice is appreciated Psychiatric: Mood and affect are normal. Speech and behavior are normal. Patient exhibits appropriate insight and judgement.   ____________________________________________   LABS (all labs ordered are listed, but only abnormal results are displayed)  Labs Reviewed  COMPREHENSIVE METABOLIC PANEL - Abnormal; Notable for the following components:      Result Value   Glucose, Bld 103 (*)    Creatinine, Ser 0.38 (*)    AST 214 (*)    ALT 278 (*)    Alkaline Phosphatase 398 (*)    Total Bilirubin 11.1 (*)    All other components within normal limits  CBC WITH DIFFERENTIAL/PLATELET - Abnormal; Notable for the following components:   RDW 15.3 (*)    All other components within normal limits  URINALYSIS, COMPLETE (UACMP) WITH MICROSCOPIC - Abnormal; Notable for the following components:   Color, Urine AMBER (*)    APPearance CLEAR (*)    Bilirubin Urine SMALL (*)    Ketones, ur 5 (*)    Bacteria, UA RARE (*)    Squamous Epithelial / LPF 0-5 (*)    All other components within normal limits  PROTIME-INR - Abnormal; Notable for the following components:   Prothrombin Time 11.0 (*)    All other components within normal limits  LIPASE, BLOOD  APTT  CBC WITH DIFFERENTIAL/PLATELET  PROTIME-INR  COMPREHENSIVE METABOLIC PANEL  HIV  ANTIBODY (ROUTINE TESTING)  CBC   ____________________________________________  EKG   ____________________________________________  RADIOLOGY  CT scan from 12/11/2016 is reviewed.  IMPRESSION: 1. 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 2. Normal common bile duct and normal pancreas. 3. Borderline enlarged periportal lymph nodes suspicious for metastatic disease. 4. No findings suspicious for hepatic metastatic disease. 5. No other significant findings in the abdomen/pelvis.  No results found.  ____________________________________________    PROCEDURES  Procedure(s) performed:    Procedures    Medications  piperacillin-tazobactam (ZOSYN) IVPB 3.375 g (not administered)  heparin injection 5,000 Units (not administered)  0.9 %  sodium chloride infusion (not administered)  acetaminophen (TYLENOL) tablet 650 mg (not administered)    Or  acetaminophen (TYLENOL) suppository 650 mg (not administered)  HYDROcodone-acetaminophen (NORCO/VICODIN) 5-325 MG per tablet 1-2 tablet (not administered)  traZODone (DESYREL) tablet 25 mg (not administered)  docusate sodium (  COLACE) capsule 100 mg (not administered)  bisacodyl (DULCOLAX) EC tablet 5 mg (not administered)  ondansetron (ZOFRAN) tablet 4 mg (not administered)    Or  ondansetron (ZOFRAN) injection 4 mg (not administered)     ____________________________________________   INITIAL IMPRESSION / ASSESSMENT AND PLAN / ED COURSE  Pertinent labs & imaging results that were available during my care of the patient were reviewed by me and considered in my medical decision making (see chart for details).  Review of the [place] CSRS was performed in accordance of the [place] prior to dispensing any controlled drugs.     Patient's diagnosis is consistent with Klatskin's tumor and  hyperbilirubinemia.  Patient was sent to the emergency department from oncology for admission and percutaneous drain.  Patient was evaluated by interventional radiology in the emergency department and scheduled for percutaneous drain in the morning.  While patient had lab work yesterday, this morning, repeat labs were ordered.  Discussed the case with hospitalist who requested that I discussed the case with surgical list before hospitalist would admit.  Discussed the case with on-call general surgeon, Dr. Rosana Hoes, who recommends that he has no surgical involvement with the patient and as such recommends hospitalist admission.  Hospitalist advised the surgeon's recommendation and advised that they will admit.  Patient to be admitted under hospitalist care with intervention by interventional radiologist in the morning.      ____________________________________________  FINAL CLINICAL IMPRESSION(S) / ED DIAGNOSES  Final diagnoses:  Hyperbilirubinemia  Klatskin's tumor ([place])      NEW MEDICATIONS STARTED DURING THIS VISIT:  ED Discharge Orders    None          This chart was dictated using voice recognition software/Dragon. Despite best efforts to proofread, errors can occur which can change the meaning. Any change was purely unintentional. .[email or web address]   Darletta Moll, PA-C 12/12/16 1826    Nena Polio, MD 12/12/16 [phone]

## 2016-12-12 NOTE — Telephone Encounter (Signed)
  Oncology Nurse Navigator Documentation Bilirubin with increase to 10.4. Spoke with Dr. Grayland Ormond, and Ms.Franken will go to the ED for admission for percutaneous drain. She verbalized understanding and is going the [facility] ED. Dr. Grayland Ormond contacting the ED. Navigator Location: [facility] On[facility] (12/12/16 1400)   )Navigator Encounter Type: Telephone;Diagnostic Results (12/12/16 1400) Telephone: Outgoing Call;Diagnostic Results (12/12/16 1400)                                                  Time Spent with Patient: 15 (12/12/16 1400)

## 2016-12-12 NOTE — Progress Notes (Signed)
  Oncology Nurse Navigator Documentation Attended consult visit with Ms. Belfiore and her family along with Dr. Grayland Ormond. Introduced Therapist, nutritional. Provided my contact number for future needs. Message sent to Dr. Ardis Hughs to see if he can attempt EUS vs. percutaneous drain by IR. Tumor markers drawn today. Referral sent to Dr. Barry Dienes. Navigator Location: [facility] (12/12/16 1300)   )Navigator Encounter Type: Initial MedOnc (12/12/16 1300)   Abnormal Finding Date: 12/12/16 (12/12/16 1300)                 Patient Visit Type: MedOnc;Initial (12/12/16 1300) Treatment Phase: Abnormal Scans (12/12/16 1300) Barriers/Navigation Needs: Education;Coordination of Care (12/12/16 1300)   Interventions: Coordination of Care;Referrals (12/12/16 1300)   Coordination of Care: Appts (12/12/16 1300)        Acuity: Level 2 (12/12/16 1300)   Acuity Level 2: Initial guidance, education and coordination as needed;Educational needs;Assistance expediting appointments;Ongoing guidance and education throughout treatment as needed (12/12/16 1300)     Time Spent with Patient: 75 (12/12/16 1300)

## 2016-12-12 NOTE — Progress Notes (Signed)
[place]  [phone]) [phone] Fax:(336) [phone]  ID: [name] OB: Feb 03, 1955  MR#: [id number]  CSN#:[id number]  Patient Care Team: Patient, No Pcp Per as PCP - General (General Practice) Clent Jacks, RN as Registered Nurse  CHIEF COMPLAINT: Klatskin's tumor  INTERVAL HISTORY: Patient is a 61 year old female who presented to urgent care yesterday with complaints of darkening urine and mild dysuria.  Patient was noted to be jaundiced and found to have a bilirubin of 7.8.  Subsequent CT scan revealed a 1.6 cm mass concerning for Klatskin's tumor.  She is anxious, but otherwise feels well.  She denies any pain.  She has no neurologic complaints.  She denies any recent fevers or illnesses.  She has had a poor appetite over the last few days, but denies any weight loss.  She has no chest pain, shortness of breath, or cough.  She denies any nausea, vomiting, constipation, or diarrhea.  She has no melena or hematochezia.  She has no additional urinary complaints.  Patient otherwise feels well and offers no further specific complaints.  REVIEW OF SYSTEMS:   Review of Systems  Constitutional: Negative.  Negative for fever, malaise/fatigue and weight loss.  Respiratory: Negative.  Negative for cough, hemoptysis and shortness of breath.   Cardiovascular: Negative.  Negative for chest pain and leg swelling.  Gastrointestinal: Negative.  Negative for abdominal pain, blood in stool and melena.  Genitourinary: Positive for dysuria. Negative for flank pain.  Musculoskeletal: Negative.   Skin: Negative.  Negative for rash.       Jaundiced  Neurological: Negative.  Negative for sensory change and weakness.  Psychiatric/Behavioral: The patient is nervous/anxious.     As per HPI. Otherwise, a complete review of systems is negative.  PAST MEDICAL HISTORY: No past medical history on file.  PAST SURGICAL HISTORY: No past surgical history on file.  FAMILY  HISTORY: Family History  Problem Relation Age of Onset  . Hypertension Mother   . Thyroid disease Mother   . Diabetes Mother   . Heart failure Mother   . Cancer Father        bone    ADVANCED DIRECTIVES (Y/N):  N  HEALTH MAINTENANCE: Social History   Tobacco Use  . Smoking status: Current Every Day Smoker  . Smokeless tobacco: Current User  Substance Use Topics  . Alcohol use: Yes  . Drug use: No     Colonoscopy:  PAP:  Bone density:  Lipid panel:  Allergies  Allergen Reactions  . Sulfa Antibiotics     Current Outpatient Medications  Medication Sig Dispense Refill  . Ascorbic Acid (VITAMIN C) 1000 MG tablet Take 1,000 mg by mouth daily.    Marland Kitchen CRANBERRY FRUIT PO Take by mouth.     No current facility-administered medications for this visit.     OBJECTIVE: Vitals:   12/12/16 1110  BP: (!) 150/95  Pulse: 97  Temp: 98.9 F (37.2 C)     Body mass index is 23.62 kg/m.    ECOG FS:0 - Asymptomatic  General: Well-developed, well-nourished, no acute distress. Eyes: Pink conjunctiva, mildly icteric sclera. HEENT: Normocephalic, moist mucous membranes, clear oropharnyx. Lungs: Clear to auscultation bilaterally. Heart: Regular rate and rhythm. No rubs, murmurs, or gallops. Abdomen: Soft, nontender, nondistended. No organomegaly noted, normoactive bowel sounds. Musculoskeletal: No edema, cyanosis, or clubbing. Neuro: Alert, answering all questions appropriately. Cranial nerves grossly intact. Skin: Jaundiced.  No rashes or petechiae noted. Psych: Normal affect. Lymphatics: No cervical, calvicular, axillary or  inguinal LAD.   LAB RESULTS:  Lab Results  Component Value Date   NA 137 12/12/2016   K 3.9 12/12/2016   CL 102 12/12/2016   CO2 24 12/12/2016   GLUCOSE 99 12/12/2016   BUN 10 12/12/2016   CREATININE 0.44 12/12/2016   CALCIUM 9.1 12/12/2016   PROT 7.9 12/12/2016   ALBUMIN 4.1 12/12/2016   AST 189 (H) 12/12/2016   ALT 266 (H) 12/12/2016   ALKPHOS  374 (H) 12/12/2016   BILITOT 10.7 (H) 12/12/2016   GFRNONAA >60 12/12/2016   GFRAA >60 12/12/2016    Lab Results  Component Value Date   WBC 7.9 12/11/2016   NEUTROABS 4.0 12/11/2016   HGB 13.1 12/11/2016   HCT 38.2 12/11/2016   MCV 92.2 12/11/2016   PLT 317 12/11/2016     STUDIES: Ct Abdomen Pelvis W Contrast  Result Date: 12/11/2016 CLINICAL DATA:  Epigastric pain, vomiting and jaundice. EXAM: CT ABDOMEN AND PELVIS WITH CONTRAST TECHNIQUE: Multidetector CT imaging of the abdomen and pelvis was performed using the standard protocol following bolus administration of intravenous contrast. CONTRAST:  187mL ISOVUE-300 IOPAMIDOL (ISOVUE-300) INJECTION 61% COMPARISON:  None. FINDINGS: Lower chest: The lung bases are clear of acute process. No pleural effusion or pulmonary lesions. The heart is normal in size. No pericardial effusion. The distal esophagus and aorta are unremarkable. Hepatobiliary: Marked intrahepatic biliary dilatation within obstructing enhancing mass involving the common hepatic duct most consistent with biliary neoplasm/Klatskin tumor. The common bile duct is normal. The gallbladder is unremarkable. No hepatic metastatic lesions. Pancreas: No mass, inflammation or ductal dilatation. Spleen: Normal size.  No focal lesions. Adrenals/Urinary Tract: The adrenal glands kidneys are normal. The bladder is unremarkable. Stomach/Bowel: The stomach, duodenum, small bowel and colon are unremarkable. Vascular/Lymphatic: Borderline enlarged upper abdominal lymph nodes. There is a 9 mm node in the celiac axis region, a 10 mm node in the periportal region on image number 23 and a 7.5 mm node near the portal vein on image number 20. The portal and hepatic veins are patent. The aorta and branch vessels are patent. Reproductive: The uterus and ovaries are normal. Other: No pelvic mass or adenopathy. No free pelvic fluid collections. No inguinal mass or adenopathy. No abdominal wall hernia or  subcutaneous lesions. Musculoskeletal: No significant bony findings. IMPRESSION: 1. 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 2. Normal common bile duct and normal pancreas. 3. Borderline enlarged periportal lymph nodes suspicious for metastatic disease. 4. No findings suspicious for hepatic metastatic disease. 5. No other significant findings in the abdomen/pelvis. These results were called by telephone at the time of interpretation on 12/11/2016 at 11:48 am to Dr. Thersa Salt , who verbally acknowledged these results. Electronically Signed   By: Marijo Sanes M.D.   On: 12/11/2016 11:48    ASSESSMENT: Klatskin's tumor  PLAN:    1. Klatskin's tumor: CT scan results reviewed independently and reported as above highly suspicious for malignancy.  Patient's bilirubin has significantly increased in the last 24 hours from 7.8-10.7.  Case was discussed with interventional radiology and determined that percutaneous drain with bile duct brushings to  obtain a diagnosis is necessary.  Tumor markers are pending at time of dictation.  Patient was sent to the emergency room for admission and evaluation for percutaneous drain.  A referral has also been sent to GI surgery for further evaluation.  Will discuss case with surgery prior to ordering  additional imaging.  The patient will likely require chemotherapy as well once a diagnosis was obtained.  Port placement was also discussed today.  Follow-up will be arranged upon discharge.  Approximately 60 minutes was spent in discussion of which greater than 50% was consultation.  Patient expressed understanding and was in agreement with this plan. She also understands that She can call clinic at any time with any questions, concerns, or complaints.   Cancer Staging No matching staging information was found for the patient.  Lloyd Huger, MD   12/12/2016 2:10 PM

## 2016-12-12 NOTE — ED Triage Notes (Signed)
Pt presents to ED from [facility]. Pt states she was told to come to ED to have a drain placed in her bile duct. Pt states Dr. Grayland Ormond was supposed to call and get her set up with interventional radiology.

## 2016-12-13 ENCOUNTER — Inpatient Hospital Stay: Payer: 59

## 2016-12-13 ENCOUNTER — Encounter: Payer: Self-pay | Admitting: Interventional Radiology

## 2016-12-13 HISTORY — PX: IR BILIARY DRAIN PLACEMENT WITH CHOLANGIOGRAM: IMG6043

## 2016-12-13 LAB — COMPREHENSIVE METABOLIC PANEL
ALBUMIN: 3.3 g/dL — AB (ref 3.5–5.0)
ALK PHOS: 327 U/L — AB (ref 38–126)
ALT: 235 U/L — ABNORMAL HIGH (ref 14–54)
AST: 183 U/L — AB (ref 15–41)
Anion gap: 6 (ref 5–15)
BILIRUBIN TOTAL: 9.8 mg/dL — AB (ref 0.3–1.2)
BUN: 12 mg/dL (ref 6–20)
CALCIUM: 8.9 mg/dL (ref 8.9–10.3)
CO2: 26 mmol/L (ref 22–32)
Chloride: 107 mmol/L (ref 101–111)
Creatinine, Ser: 0.47 mg/dL (ref 0.44–1.00)
GFR calc Af Amer: >60 mL/min (ref >60)
GLUCOSE: 104 mg/dL — AB (ref 65–99)
Potassium: 3.9 mmol/L (ref 3.5–5.1)
Sodium: 139 mmol/L (ref 135–145)
TOTAL PROTEIN: 6.4 g/dL — AB (ref 6.5–8.1)

## 2016-12-13 LAB — CBC WITH DIFFERENTIAL/PLATELET
BASOS PCT: 2 %
Basophils Absolute: 0.1 10*3/uL (ref 0–0.1)
EOS ABS: 0.2 10*3/uL (ref 0–0.7)
EOS PCT: 4 %
HCT: 35.6 % (ref 35.0–47.0)
HEMOGLOBIN: 12 g/dL (ref 12.0–16.0)
LYMPHS ABS: 2 10*3/uL (ref 1.0–3.6)
Lymphocytes Relative: 30 %
MCH: 31.2 pg (ref 26.0–34.0)
MCHC: 33.7 g/dL (ref 32.0–36.0)
MCV: 92.7 fL (ref 80.0–100.0)
Monocytes Absolute: 0.8 10*3/uL (ref 0.2–0.9)
Monocytes Relative: 12 %
NEUTROS PCT: 52 %
Neutro Abs: 3.4 10*3/uL (ref 1.4–6.5)
PLATELETS: 319 10*3/uL (ref 150–440)
RBC: 3.84 MIL/uL (ref 3.80–5.20)
RDW: 15.2 % — ABNORMAL HIGH (ref 11.5–14.5)
WBC: 6.5 10*3/uL (ref 3.6–11.0)

## 2016-12-13 LAB — PROTIME-INR
INR: 0.83
Prothrombin Time: 11.3 seconds — ABNORMAL LOW (ref 11.4–15.2)

## 2016-12-13 LAB — CEA: CEA: 3.7 ng/mL (ref 0.0–4.7)

## 2016-12-13 LAB — GLUCOSE, CAPILLARY: GLUCOSE-CAPILLARY: 85 mg/dL (ref 65–99)

## 2016-12-13 LAB — AFP TUMOR MARKER: AFP, SERUM, TUMOR MARKER: 7.9 ng/mL (ref 0.0–8.3)

## 2016-12-13 LAB — CANCER ANTIGEN 19-9: CAN 19-9: 39 U/mL — AB (ref 0–35)

## 2016-12-13 MED ORDER — IOPAMIDOL (ISOVUE-300) INJECTION 61%
30.0000 mL | Freq: Once | INTRAVENOUS | Status: AC | PRN
  Administered 2016-12-13: 15 mL via INTRAVENOUS

## 2016-12-13 MED ORDER — FENTANYL CITRATE (PF) 100 MCG/2ML IJ SOLN
INTRAMUSCULAR | Status: AC
  Filled 2016-12-13: qty 2

## 2016-12-13 MED ORDER — MIDAZOLAM HCL 5 MG/5ML IJ SOLN
INTRAMUSCULAR | Status: AC | PRN
  Administered 2016-12-13 (×2): 1 mg via INTRAVENOUS

## 2016-12-13 MED ORDER — HYDROXYZINE HCL 25 MG PO TABS
25.0000 mg | ORAL_TABLET | Freq: Three times a day (TID) | ORAL | Status: DC | PRN
  Administered 2016-12-13: 22:00:00 25 mg via ORAL
  Filled 2016-12-13 (×2): qty 1

## 2016-12-13 MED ORDER — LIDOCAINE HCL (PF) 1 % IJ SOLN
INTRAMUSCULAR | Status: AC
  Filled 2016-12-13: qty 30

## 2016-12-13 MED ORDER — PROMETHAZINE HCL 25 MG/ML IJ SOLN
25.0000 mg | Freq: Four times a day (QID) | INTRAMUSCULAR | Status: DC | PRN
  Administered 2016-12-13 (×2): 25 mg via INTRAVENOUS
  Filled 2016-12-13 (×2): qty 1

## 2016-12-13 MED ORDER — FENTANYL CITRATE (PF) 100 MCG/2ML IJ SOLN
INTRAMUSCULAR | Status: AC | PRN
  Administered 2016-12-13 (×2): 50 ug via INTRAVENOUS

## 2016-12-13 MED ORDER — SODIUM CHLORIDE 0.9% FLUSH
5.0000 mL | Freq: Three times a day (TID) | INTRAVENOUS | Status: DC
  Administered 2016-12-13 – 2016-12-14 (×4): 5 mL via INTRAVENOUS

## 2016-12-13 MED ORDER — MIDAZOLAM HCL 5 MG/5ML IJ SOLN
INTRAMUSCULAR | Status: AC
  Filled 2016-12-13: qty 5

## 2016-12-13 MED ORDER — HYDROMORPHONE HCL 1 MG/ML IJ SOLN
1.0000 mg | INTRAMUSCULAR | Status: DC | PRN
  Administered 2016-12-13 – 2016-12-14 (×6): 1 mg via INTRAVENOUS
  Filled 2016-12-13 (×6): qty 1

## 2016-12-13 NOTE — Progress Notes (Signed)
[place] [facility] [place] NAME: Sierra Elliott    MR#:  [id number]  DATE OF BIRTH:  [birth year]  SUBJECTIVE:   Patient doing well this morning. Had some nausea last night.  REVIEW OF SYSTEMS:    Review of Systems  Constitutional: Negative for fever, chills weight loss HENT: Negative for ear pain, nosebleeds, congestion, facial swelling, rhinorrhea, neck pain, neck stiffness and ear discharge.   Respiratory: Negative for cough, shortness of breath, wheezing  Cardiovascular: Negative for chest pain, palpitations and leg swelling.  Gastrointestinal: Negative for heartburn, abdominal pain, vomiting, diarrhea or consitpation ++nausea Genitourinary: Negative for dysuria, urgency, frequency, hematuria Musculoskeletal: Negative for back pain or joint pain Neurological: Negative for dizziness, seizures, syncope, focal weakness,  numbness and headaches.  Hematological: Does not bruise/bleed easily.  Psychiatric/Behavioral: Negative for hallucinations, confusion, dysphoric mood    Tolerating Diet: yes      DRUG ALLERGIES:   Allergies  Allergen Reactions  . Sulfa Antibiotics     VITALS:  Blood pressure 119/65, pulse (!) 57, temperature 98 F (36.7 C), temperature source Oral, resp. rate 14, height 5' (1.524 m), weight 54.2 kg (119 lb 6.4 oz), SpO2 96 %.  PHYSICAL EXAMINATION:  Constitutional: Appears well-developed and well-nourished. No distress. HENT: Normocephalic. Marland Kitchen Oropharynx is clear and moist.  Eyes: Conjunctivae and EOM are normal. PERRLA, no scleral icterus.  Neck: Normal ROM. Neck supple. No JVD. No tracheal deviation. CVS: RRR, S1/S2 +, no murmurs, no gallops, no carotid bruit.  Pulmonary: Effort and breath sounds normal, no stridor, rhonchi, wheezes, rales.  Abdominal: Soft. BS +,  no distension, tenderness, rebound or guarding.  Musculoskeletal: Normal range of motion. No edema and no tenderness.  Neuro: Alert. CN 2-12 grossly  intact. No focal deficits. Skin: Skin is warm and dry. No rash noted. Psychiatric: Normal mood and affect.      LABORATORY PANEL:   CBC Recent Labs  Lab 12/13/16 0500  WBC 6.5  HGB 12.0  HCT 35.6  PLT 319   ------------------------------------------------------------------------------------------------------------------  Chemistries  Recent Labs  Lab 12/13/16 0500  NA 139  K 3.9  CL 107  CO2 26  GLUCOSE 104*  BUN 12  CREATININE 0.47  CALCIUM 8.9  AST 183*  ALT 235*  ALKPHOS 327*  BILITOT 9.8*   ------------------------------------------------------------------------------------------------------------------  Cardiac Enzymes No results for input(s): TROPONINI in the last 168 hours. ------------------------------------------------------------------------------------------------------------------  RADIOLOGY:  Ct Abdomen Pelvis W Contrast  Result Date: 12/11/2016 CLINICAL DATA:  Epigastric pain, vomiting and jaundice. EXAM: CT ABDOMEN AND PELVIS WITH CONTRAST TECHNIQUE: Multidetector CT imaging of the abdomen and pelvis was performed using the standard protocol following bolus administration of intravenous contrast. CONTRAST:  183mL ISOVUE-300 IOPAMIDOL (ISOVUE-300) INJECTION 61% COMPARISON:  None. FINDINGS: Lower chest: The lung bases are clear of acute process. No pleural effusion or pulmonary lesions. The heart is normal in size. No pericardial effusion. The distal esophagus and aorta are unremarkable. Hepatobiliary: Marked intrahepatic biliary dilatation within obstructing enhancing mass involving the common hepatic duct most consistent with biliary neoplasm/Klatskin tumor. The common bile duct is normal. The gallbladder is unremarkable. No hepatic metastatic lesions. Pancreas: No mass, inflammation or ductal dilatation. Spleen: Normal size.  No focal lesions. Adrenals/Urinary Tract: The adrenal glands kidneys are normal. The bladder is unremarkable. Stomach/Bowel: The  stomach, duodenum, small bowel and colon are unremarkable. Vascular/Lymphatic: Borderline enlarged upper abdominal lymph nodes. There is a 9 mm node in the celiac axis region, a 10 mm node in the  periportal region on image number 23 and a 7.5 mm node near the portal vein on image number 20. The portal and hepatic veins are patent. The aorta and branch vessels are patent. Reproductive: The uterus and ovaries are normal. Other: No pelvic mass or adenopathy. No free pelvic fluid collections. No inguinal mass or adenopathy. No abdominal wall hernia or subcutaneous lesions. Musculoskeletal: No significant bony findings. IMPRESSION: 1. 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 2. Normal common bile duct and normal pancreas. 3. Borderline enlarged periportal lymph nodes suspicious for metastatic disease. 4. No findings suspicious for hepatic metastatic disease. 5. No other significant findings in the abdomen/pelvis. These results were called by telephone at the time of interpretation on 12/11/2016 at 11:48 am to Dr. Thersa Salt , who verbally acknowledged these results. Electronically Signed   By: Marijo Sanes M.D.   On: 12/11/2016 11:48     ASSESSMENT AND PLAN:   61 year old female with recent diagnosis of 1.6 cm mass concerning for K[place]in's tumor who was directly admitted from oncology due to increaed LFTs/bili.  1. Elevated LFTS due to obstuction from Klatskin's tumor: Plan for IR to place PC drain today Repeat LFTS tomorrow  2. Klatskin's tumor: She will follow up with ONCOLOGY after discharge.      Management plans discussed with the patient and she is in agreement.  CODE STATUS: full  TOTAL TIME TAKING CARE OF THIS PATIENT: 30 minutes.     POSSIBLE D/C tomorrow, DEPENDING ON CLINICAL CONDITION.   Sora Olivo M.D on 12/13/2016 at 8:46 AM  Between 7am to 6pm -  Pager - [phone] After 6pm go to [email or web address] - password [email or web address] [facility]  Office  [phone]  CC: Primary care physician; Patient, No Pcp Per  Note: This dictation was prepared with Dragon dictation along with smaller phrase technology. Any transcriptional errors that result from this process are unintentional.

## 2016-12-13 NOTE — Procedures (Signed)
Interventional Radiology Procedure Note  Procedure: Internal/external biliary drainage catheter placement with cholangiogram  Complications: None  Estimated Blood Loss: < 10 mL  Findings: Diffuse intrahepatic biliary ductal dilatation by [place].  After single stick of a peripheral, inferior right lobe duct with 21 G needle under US guidance, access established allowing placement of a 10 Fr internal/external biliary drainage catheter down to level of duodenum.  Attached to gravity bag.  Venetia Night. Kathlene Cote, M.D Pager:  [phone]

## 2016-12-14 LAB — COMPREHENSIVE METABOLIC PANEL
ALK PHOS: 294 U/L — AB (ref 38–126)
ALT: 242 U/L — AB (ref 14–54)
AST: 201 U/L — AB (ref 15–41)
Albumin: 3.1 g/dL — ABNORMAL LOW (ref 3.5–5.0)
Anion gap: 7 (ref 5–15)
BUN: 15 mg/dL (ref 6–20)
CALCIUM: 8.8 mg/dL — AB (ref 8.9–10.3)
CO2: 26 mmol/L (ref 22–32)
CREATININE: 0.47 mg/dL (ref 0.44–1.00)
Chloride: 107 mmol/L (ref 101–111)
GFR calc Af Amer: >60 mL/min (ref >60)
Glucose, Bld: 102 mg/dL — ABNORMAL HIGH (ref 65–99)
Potassium: 3.9 mmol/L (ref 3.5–5.1)
Sodium: 140 mmol/L (ref 135–145)
Total Bilirubin: 8.8 mg/dL — ABNORMAL HIGH (ref 0.3–1.2)
Total Protein: 6.4 g/dL — ABNORMAL LOW (ref 6.5–8.1)

## 2016-12-14 LAB — GLUCOSE, CAPILLARY: Glucose-Capillary: 91 mg/dL (ref 65–99)

## 2016-12-14 LAB — HIV ANTIBODY (ROUTINE TESTING W REFLEX): HIV SCREEN 4TH GENERATION: NONREACTIVE

## 2016-12-14 MED ORDER — ONDANSETRON HCL 4 MG PO TABS: 4.0000 mg | ORAL_TABLET | Freq: Four times a day (QID) | ORAL | 0 refills | Status: DC | PRN

## 2016-12-14 MED ORDER — HYDROMORPHONE HCL 2 MG PO TABS: 2.0000 mg | ORAL_TABLET | Freq: Four times a day (QID) | ORAL | 0 refills | Status: DC | PRN

## 2016-12-14 MED ORDER — HYDROCODONE-ACETAMINOPHEN 5-325 MG PO TABS: 1.0000 | ORAL_TABLET | ORAL | 0 refills | Status: DC | PRN

## 2016-12-14 NOTE — Discharge Instructions (Signed)
Per Dr. Kathlene Cote  1. Do not submerge drain site in water 2. You can sponge bath, and clean around site but please do not get site wet 3. Please flush the drain with 68ml of sterile normal saline once daily 4. You will be going home with the drain bag 5. Do not cap the drain; let the bag drain to gravity 6. If you have any issues or need assistance, Please contact the [facility]- Interventional Radiology

## 2016-12-14 NOTE — Discharge Summary (Addendum)
[facility] NAME: Sierra Elliott    MR#:  [id number]  DATE OF BIRTH:  [birth year]  DATE OF ADMISSION:  12/12/2016 ADMITTING PHYSICIAN: Epifanio Lesches, MD  DATE OF DISCHARGE: 12/14/2016  PRIMARY CARE PHYSICIAN: Dr. 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 No pertinent past medical history.  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  1. Elevated LFTS due to obstuction from Klatskin's tumor: She had an interventional radiology biliary drain placement with cholangiogram. LFTs are slowly improving  2. Klatskin's tumor: She will follow up with ONCOLOGY after discharge.     DISCHARGE CONDITIONS AND DIET:   Stable for discharge to regular diet  CONSULTS OBTAINED:    DRUG ALLERGIES:   Allergies  Allergen Reactions  . Sulfa Antibiotics     DISCHARGE MEDICATIONS:   Allergies as of 12/14/2016      Reactions   Sulfa Antibiotics       Medication List    TAKE these medications   CRANBERRY FRUIT PO Take by mouth.   HYDROmorphone 2 MG tablet Commonly known as:  DILAUDID Take 1 tablet (2 mg total) by mouth every 6 (six) hours as needed for severe pain.   ondansetron 4 MG tablet Commonly known as:  ZOFRAN Take 1 tablet (4 mg total) by mouth every 6 (six) hours as needed for nausea.   vitamin C 1000 MG tablet Take 1,000 mg by mouth daily.         Today   CHIEF COMPLAINT:  Had some nausea and abdominal pain at the site of biliary drain.   VITAL SIGNS:  Blood pressure 118/66, pulse 76, temperature 99.4 F (37.4 C), temperature source Oral, resp. rate 16, height 5' (1.524 m), weight  56.2 kg (124 lb), SpO2 95 %.   REVIEW OF SYSTEMS:  Review of Systems  Constitutional: Negative.  Negative for chills, fever and malaise/fatigue.  HENT: Negative.  Negative for ear discharge, ear pain, hearing loss, nosebleeds and sore throat.   Eyes: Negative.  Negative for blurred vision and pain.  Respiratory: Negative.  Negative for cough, hemoptysis, shortness of breath and wheezing.   Cardiovascular: Negative.  Negative for chest pain, palpitations and leg swelling.  Gastrointestinal: Positive for abdominal pain and nausea. Negative for blood in stool, diarrhea and vomiting.  Genitourinary: Negative.  Negative for dysuria.  Musculoskeletal: Negative.  Negative for back pain.  Skin: Negative.   Neurological: Negative for dizziness, tremors, speech change, focal weakness, seizures and headaches.  Endo/Heme/Allergies: Negative.  Does not bruise/bleed easily.  Psychiatric/Behavioral: Negative.  Negative for depression, hallucinations and suicidal ideas.     PHYSICAL EXAMINATION:   GENERAL:  61 y.o.-year-old patient lying in the bed with no acute distress.  NECK:  Supple, no jugular venous distention. No thyroid enlargement, no tenderness.  LUNGS: Normal breath sounds bilaterally, no wheezing, rales,rhonchi  No use of accessory muscles of respiration.  CARDIOVASCULAR: S1, S2 normal. No murmurs, rubs, or gallops.  ABDOMEN: Soft, non-tender, non-distended. Bowel sounds present. No organomegaly or mass.  She has drain placed EXTREMITIES: No pedal edema, cyanosis, or clubbing.  PSYCHIATRIC: The patient is alert and oriented x 3.  SKIN: No obvious rash, lesion, or ulcer.  DATA REVIEW:   CBC Recent Labs  Lab 12/13/16 0500  WBC 6.5  HGB 12.0  HCT 35.6  PLT 319    Chemistries  Recent Labs  Lab 12/14/16 0542  NA 140  K 3.9  CL 107  CO2 26  GLUCOSE 102*  BUN 15  CREATININE 0.47  CALCIUM 8.8*  AST 201*  ALT 242*  ALKPHOS 294*  BILITOT 8.8*    Cardiac Enzymes No  results for input(s): TROPONINI in the last 168 hours.  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 The patient presents for percutaneous biliary drainage procedure. EXAM: PERCUTANEOUS INTERNAL/ EXTERNAL BILIARY DRAINAGE CATHETER PLACEMENT WITH CHOLANGIOGRAM MEDICATIONS: 3.375 g IV Zosyn; The antibiotic was administered within an appropriate time frame prior to the initiation of the procedure. ANESTHESIA/SEDATION: Moderate (conscious) sedation was employed during this procedure. A total of Versed 2.0 mg and Fentanyl 100 mcg was administered intravenously. Moderate Sedation Time: 25 minutes. The patient's level of consciousness and vital signs were monitored continuously by radiology nursing throughout the procedure under my direct supervision. FLUOROSCOPY TIME:  Fluoroscopy Time: 4 minutes and 12 seconds. 36 mGy. COMPLICATIONS: None immediate. PROCEDURE: Informed written consent was obtained from the patient after a thorough discussion of the procedural risks, benefits and alternatives. All questions were addressed. Maximal Sterile Barrier Technique was utilized including caps, mask, sterile gowns, sterile gloves, sterile drape, hand hygiene and skin antiseptic. A timeout was performed prior to the initiation of the procedure. Ultrasound was performed initially to localize the liver and intrahepatic bile ducts. Under direct ultrasound guidance, a 21 gauge needle was advanced into the inferior right lobe of the liver. Puncture of a peripheral right lobe bile duct was performed. After return of bile, contrast injection was performed and cholangiogram images saved. A guidewire was advanced into the biliary tree. A transitional dilator was then placed and advanced centrally. Additional contrast injection was performed at the level of the common bile duct. A  5 French catheter was then advanced over a guidewire. Guidewire access was further advanced into the distal duodenum. The catheter was removed and percutaneous access dilated to 10 [place] over the guidewire. A 10 [place] internal/ external biliary drainage catheter was then advanced over the wire. The distal portion was formed in the duodenum and the guidewire removed. A sample of bile was obtained and sent for cytologic analysis. Catheter positioning was confirmed by fluoroscopy after injection of contrast material. The catheter was connected to a gravity bag and secured at the skin with a Prolene retention suture and adhesive StatLock device. FINDINGS: After access of a peripheral right intrahepatic bile duct, cholangiogram demonstrates irregular central stricture of bile ducts at the confluence of right-sided and left-sided ducts. Left-sided ducts are not well opacified. Stricture and obstruction extends into the proper hepatic bile duct. The level of biliary obstruction was able to be crossed allowing placement of an internal/external biliary drainage catheter all the way to the level of the duodenum. Sideholes extend from the duodenum up into right-sided intrahepatic bile ducts. Contrast injection after drainage catheter placement again does not reflux left-sided ducts and the left-sided ductal system may be relatively isolated by obstruction. Initial drainage will be performed from the current catheter to determine degree of response of bilirubin to catheter drainage. Ultimately, a separate left-sided biliary drainage catheter may be necessary if there is insufficient diminishment in bilirubin level with corresponding imaging  evidence of persistent left-sided intrahepatic biliary ductal dilatation. IMPRESSION: Successful placement of percutaneous internal/ external biliary drainage catheter via right sided hepatic biliary access. Cholangiogram demonstrates high-grade biliary stricture and obstruction centrally  within the liver affecting the biliary confluence and proper hepatic duct. A drainage catheter was able to be advanced all the way to the level of the duodenum. Left-sided bile ducts may be relatively isolated by obstruction based on the cholangiogram. Bilirubin level will be followed with current catheter drainage to gravity bag. A sample of bile was sent for cytologic analysis. Electronically Signed   By: Aletta Edouard M.D.   On: 12/13/2016 13:08      Allergies as of 12/14/2016      Reactions   Sulfa Antibiotics       Medication List    TAKE these medications   CRANBERRY FRUIT PO Take by mouth.   HYDROmorphone 2 MG tablet Commonly known as:  DILAUDID Take 1 tablet (2 mg total) by mouth every 6 (six) hours as needed for severe pain.   ondansetron 4 MG tablet Commonly known as:  ZOFRAN Take 1 tablet (4 mg total) by mouth every 6 (six) hours as needed for nausea.   vitamin C 1000 MG tablet Take 1,000 mg by mouth daily.          Management plans discussed with the patient and she is in agreement. Stable for discharge home  Patient should follow up with dr Grayland Ormond  CODE STATUS:     Code Status Orders  (From admission, onward)        Start     Ordered   12/12/16 1741  Full code  Continuous     12/12/16 1741    Code Status History    Date Active Date Inactive Code Status Order ID Comments User Context   This patient has a current code status but no historical code status.      TOTAL TIME TAKING CARE OF THIS PATIENT: 38 minutes.    Note: This dictation was prepared with Dragon dictation along with smaller phrase technology. Any transcriptional errors that result from this process are unintentional.  Cabrini Ruggieri M.D on 12/14/2016 at 10:48 AM  Between 7am to 6pm - Pager - [phone] After 6pm go to [email or web address] - password [email or web address] [facility]  Office  [phone]  CC: Primary care physician; Dr. Delight Hoh

## 2016-12-14 NOTE — Progress Notes (Signed)
After Care for Biliary Drain reviewed with patient and caretakers with patient's permission. Called Dr. Kathlene Cote to clarify orders and drain care with 54ml of flush daily per MD. Reviewed instructions with patient and caregiver and all parties verbalized understanding. Rx given to patient for Dilaudid PO- sedative drug warning reviewed with patient. Supplies given to patient for home care of drain. Patient reminded for call Dr. Gary Fleet office to verify time of appointment on Tuesday. Family to take patient home.

## 2016-12-15 ENCOUNTER — Other Ambulatory Visit: Payer: Self-pay | Admitting: *Deleted

## 2016-12-15 ENCOUNTER — Telehealth: Payer: Self-pay

## 2016-12-15 ENCOUNTER — Encounter: Payer: Self-pay | Admitting: *Deleted

## 2016-12-15 ENCOUNTER — Ambulatory Visit: Payer: Self-pay | Admitting: *Deleted

## 2016-12-15 NOTE — Telephone Encounter (Signed)
After communicating with Dr. Barry Dienes, due to the complication of Klatskin's tumors she would be better served at tertiary center. Referral made to Dr. Cloyd Stagers as recommended. Appointment is Friday, 12/7, 0900 with 0830 arrival time at [facility] and spoke with Ms. [place] regarding. She prefers a referral to [facility] if possible. I will make referral to [facility] as requested. I have instructed her to call her insurance company for early notification regarding need to go out of network. Scheduling notified to make appointment with Dr. Grayland Ormond this week or early next week for follow up. Oncology Nurse Navigator Documentation  Navigator Location: [facility] (12/15/16 1000)   )Navigator Encounter Type: Telephone (12/15/16 1000) Telephone: Lahoma Crocker Call;Appt Confirmation/Clarification (12/15/16 1000)                                                  Time Spent with Patient: 30 (12/15/16 1000)

## 2016-12-15 NOTE — Telephone Encounter (Signed)
Called and spoke with Dorothea Ogle at [facility]. Referral made to Dr. Mariah Milling. With [employer] it has to go to there review and they notify insurance company. This process can take two weeks. Once approved they can make appointment within one week. I have spoken with Ms. Prins and notified that this may take up to three weeks. She has decided to keep appointment with [facility]. She has notified her human resources and they are going to help her with being possible out of network. Dr. Grayland Ormond made aware of the above. Oncology Nurse Navigator Documentation  Navigator Location: [facility] (12/15/16 1122)   )Navigator Encounter Type: Telephone (12/15/16 1122) Telephone: Outgoing Call (12/15/16 1122)                                                  Time Spent with Patient: 30 (12/15/16 1122)

## 2016-12-15 NOTE — Telephone Encounter (Signed)
Received call from Dr. Grayland Ormond. He would like to plan for ERCP Thursday with Dr. Allen Norris in the case her current cytology is not sufficient for diagnosis. I have contacted Ms. Braman and updated her on this plan. I have spoken with Ginger at [facility] and they will arrange and call her with the details regarding ERCP. Also notified Ms. Rumble of appointment with Dr. Grayland Ormond 12/4 for labs at 1115 and see him at 1130. Read back performed. Directions within cancer center given. Oncology Nurse Navigator Documentation  Navigator Location: [facility] (12/15/16 1300)   )Navigator Encounter Type: Telephone (12/15/16 1300) Telephone: [place] Call (12/15/16 1300)                                                  Time Spent with Patient: 15 (12/15/16 1300)

## 2016-12-15 NOTE — Patient Outreach (Signed)
Successful outreach to patient (she goes by Sierra Elliott) to complete transition of care call. She was hospitalized from the emergency department at [facility] on 12/12/16 with worsening jaundice and elevated liver enzymes after a recent diagnosis of Klanskin tumor on 12/11/16 (total bilirubin= 7.8) . She required percutaneous drainage with biliary duct brushing and insertion of biliary drain on 12/13/16. She was discharged to home on 12/14/16 with the biliary drain in place. Per chart information, she is tentatively scheduled for ERCP on 12/18/16 by Dr. Allen Norris. Please see the transition of care template for details.  Reviewed discharge instructions related to the care of the biliary drain and she denies any difficulty with the daily irrigation procedure.  After verifying her home address, advised Jackelyn Poling that a [employer] with contact infromation will be mailed to her she should require assistance or have questions in  the future.  No immediate care management needs were identified. Barrington Ellison RN,CCM,CDE [place] Management Coordinator [facility] [employer] [phone] Office Fax [phone]

## 2016-12-16 ENCOUNTER — Inpatient Hospital Stay (HOSPITAL_BASED_OUTPATIENT_CLINIC_OR_DEPARTMENT_OTHER): Payer: 59 | Admitting: Oncology

## 2016-12-16 ENCOUNTER — Other Ambulatory Visit: Payer: Self-pay

## 2016-12-16 ENCOUNTER — Inpatient Hospital Stay: Payer: 59 | Attending: Oncology

## 2016-12-16 VITALS — BP 124/75 | HR 84 | Temp 97.9°F | Resp 18 | Wt 118.5 lb

## 2016-12-16 DIAGNOSIS — F17210 Nicotine dependence, cigarettes, uncomplicated: Secondary | ICD-10-CM | POA: Insufficient documentation

## 2016-12-16 DIAGNOSIS — K831 Obstruction of bile duct: Secondary | ICD-10-CM

## 2016-12-16 DIAGNOSIS — C240 Malignant neoplasm of extrahepatic bile duct: Secondary | ICD-10-CM | POA: Diagnosis not present

## 2016-12-16 DIAGNOSIS — Z808 Family history of malignant neoplasm of other organs or systems: Secondary | ICD-10-CM | POA: Insufficient documentation

## 2016-12-16 DIAGNOSIS — R945 Abnormal results of liver function studies: Secondary | ICD-10-CM

## 2016-12-16 DIAGNOSIS — F419 Anxiety disorder, unspecified: Secondary | ICD-10-CM | POA: Insufficient documentation

## 2016-12-16 DIAGNOSIS — R590 Localized enlarged lymph nodes: Secondary | ICD-10-CM

## 2016-12-16 DIAGNOSIS — G893 Neoplasm related pain (acute) (chronic): Secondary | ICD-10-CM | POA: Diagnosis not present

## 2016-12-16 DIAGNOSIS — R7989 Other specified abnormal findings of blood chemistry: Secondary | ICD-10-CM

## 2016-12-16 LAB — COMPREHENSIVE METABOLIC PANEL
ALK PHOS: 300 U/L — AB (ref 38–126)
ALT: 223 U/L — AB (ref 14–54)
ANION GAP: 9 (ref 5–15)
AST: 137 U/L — ABNORMAL HIGH (ref 15–41)
Albumin: 3.8 g/dL (ref 3.5–5.0)
BUN: 14 mg/dL (ref 6–20)
CALCIUM: 9.8 mg/dL (ref 8.9–10.3)
CO2: 27 mmol/L (ref 22–32)
CREATININE: 0.68 mg/dL (ref 0.44–1.00)
Chloride: 98 mmol/L — ABNORMAL LOW (ref 101–111)
Glucose, Bld: 188 mg/dL — ABNORMAL HIGH (ref 65–99)
Potassium: 4.4 mmol/L (ref 3.5–5.1)
Sodium: 134 mmol/L — ABNORMAL LOW (ref 135–145)
Total Bilirubin: 7.9 mg/dL — ABNORMAL HIGH (ref 0.3–1.2)
Total Protein: 7.8 g/dL (ref 6.5–8.1)

## 2016-12-16 MED ORDER — FENTANYL 25 MCG/HR TD PT72: 25.0000 ug | MEDICATED_PATCH | TRANSDERMAL | 0 refills | Status: DC

## 2016-12-17 ENCOUNTER — Telehealth: Payer: Self-pay

## 2016-12-17 LAB — CYTOLOGY - NON PAP

## 2016-12-17 MED ORDER — HYDROMORPHONE HCL 2 MG PO TABS: 2.0000 mg | ORAL_TABLET | Freq: Four times a day (QID) | ORAL | 0 refills | Status: DC | PRN

## 2016-12-17 NOTE — Progress Notes (Signed)
[place]  [phone] [phone]  ID: Sierra Elliott OB: July 10, 1955  MR#: [id number]  CSN#:[id number]  Patient Care Team: Patient, No Pcp Per as PCP - General (General Practice) Clent Jacks, RN as Registered Nurse Serena Colonel, RN as [place] Management  CHIEF COMPLAINT: Klatskin's tumor  INTERVAL HISTORY: Patient returns to clinic today for hospital follow-up and laboratory work.  She had a percutaneous biliary drain placed over the weekend with improvement of her bilirubin.  She continues to have pain.  She otherwise feels well. She has no neurologic complaints.  She denies any recent fevers or illnesses.  She has had a poor appetite, but denies any weight loss.  She has no chest pain, shortness of breath, or cough.  She denies any nausea, vomiting, constipation, or diarrhea.  She has no melena or hematochezia.  She has no urinary complaints.  Patient otherwise feels well and offers no further specific complaints.  REVIEW OF SYSTEMS:   Review of Systems  Constitutional: Negative.  Negative for fever, malaise/fatigue and weight loss.  Respiratory: Negative.  Negative for cough, hemoptysis and shortness of breath.   Cardiovascular: Negative.  Negative for chest pain and leg swelling.  Gastrointestinal: Negative.  Negative for abdominal pain, blood in stool and melena.  Genitourinary: Negative.  Negative for dysuria and flank pain.  Musculoskeletal: Negative.   Skin: Negative.  Negative for rash.       Jaundiced  Neurological: Negative.  Negative for sensory change and weakness.  Psychiatric/Behavioral: The patient is nervous/anxious.     As per HPI. Otherwise, a complete review of systems is negative.  PAST MEDICAL HISTORY: No past medical history on file.  PAST SURGICAL HISTORY: Past Surgical History:  Procedure Laterality Date  . IR BILIARY DRAIN PLACEMENT WITH CHOLANGIOGRAM  12/13/2016    FAMILY  HISTORY: Family History  Problem Relation Age of Onset  . Hypertension Mother   . Thyroid disease Mother   . Diabetes Mother   . Heart failure Mother   . Cancer Father        bone    ADVANCED DIRECTIVES (Y/N):  N  HEALTH MAINTENANCE: Social History   Tobacco Use  . Smoking status: Current Every Day Smoker    Packs/day: 1.00  . Smokeless tobacco: Never Used  Substance Use Topics  . Alcohol use: Yes    Comment: occassionally  . Drug use: No     Colonoscopy:  PAP:  Bone density:  Lipid panel:  Allergies  Allergen Reactions  . Sulfa Antibiotics     Current Outpatient Medications  Medication Sig Dispense Refill  . Ascorbic Acid (VITAMIN C) 1000 MG tablet Take 1,000 mg by mouth daily.    Marland Kitchen CRANBERRY FRUIT PO Take by mouth.    . docusate sodium (COLACE) 100 MG capsule Take 100 mg by mouth 2 (two) times daily.    Marland Kitchen HYDROmorphone (DILAUDID) 2 MG tablet Take 1 tablet (2 mg total) by mouth every 6 (six) hours as needed for severe pain. 60 tablet 0  . ondansetron (ZOFRAN) 4 MG tablet Take 1 tablet (4 mg total) by mouth every 6 (six) hours as needed for nausea. 20 tablet 0  . fentaNYL (DURAGESIC - DOSED MCG/HR) 25 MCG/HR patch Place 1 patch (25 mcg total) onto the skin every 3 (three) days. 10 patch 0   No current facility-administered medications for this visit.     OBJECTIVE: Vitals:   12/16/16 1150  BP: 124/75  Pulse:  84  Resp: 18  Temp: 97.9 F (36.6 C)     Body mass index is 23.14 kg/m.    ECOG FS:1 - Symptomatic but completely ambulatory  General: Well-developed, well-nourished, no acute distress. Eyes: Pink conjunctiva, mildly icteric sclera. Lungs: Clear to auscultation bilaterally. Heart: Regular rate and rhythm. No rubs, murmurs, or gallops. Abdomen: Soft, nontender, nondistended. No organomegaly noted, normoactive bowel sounds. Musculoskeletal: No edema, cyanosis, or clubbing. Neuro: Alert, answering all questions appropriately. Cranial nerves grossly  intact. Skin: Jaundiced.  No rashes or petechiae noted. Psych: Normal affect.   LAB RESULTS:  Lab Results  Component Value Date   NA 134 (L) 12/16/2016   K 4.4 12/16/2016   CL 98 (L) 12/16/2016   CO2 27 12/16/2016   GLUCOSE 188 (H) 12/16/2016   BUN 14 12/16/2016   CREATININE 0.68 12/16/2016   CALCIUM 9.8 12/16/2016   PROT 7.8 12/16/2016   ALBUMIN 3.8 12/16/2016   AST 137 (H) 12/16/2016   ALT 223 (H) 12/16/2016   ALKPHOS 300 (H) 12/16/2016   BILITOT 7.9 (H) 12/16/2016   GFRNONAA >60 12/16/2016   GFRAA >60 12/16/2016    Lab Results  Component Value Date   WBC 6.5 12/13/2016   NEUTROABS 3.4 12/13/2016   HGB 12.0 12/13/2016   HCT 35.6 12/13/2016   MCV 92.7 12/13/2016   PLT 319 12/13/2016     STUDIES: Ct Abdomen Pelvis W Contrast  Result Date: 12/11/2016 CLINICAL DATA:  Epigastric pain, vomiting and jaundice. EXAM: CT ABDOMEN AND PELVIS WITH CONTRAST TECHNIQUE: Multidetector CT imaging of the abdomen and pelvis was performed using the standard protocol following bolus administration of intravenous contrast. CONTRAST:  18mL ISOVUE-300 IOPAMIDOL (ISOVUE-300) INJECTION 61% COMPARISON:  None. FINDINGS: Lower chest: The lung bases are clear of acute process. No pleural effusion or pulmonary lesions. The heart is normal in size. No pericardial effusion. The distal esophagus and aorta are unremarkable. Hepatobiliary: Marked intrahepatic biliary dilatation within obstructing enhancing mass involving the common hepatic duct most consistent with biliary neoplasm/Klatskin tumor. The common bile duct is normal. The gallbladder is unremarkable. No hepatic metastatic lesions. Pancreas: No mass, inflammation or ductal dilatation. Spleen: Normal size.  No focal lesions. Adrenals/Urinary Tract: The adrenal glands kidneys are normal. The bladder is unremarkable. Stomach/Bowel: The stomach, duodenum, small bowel and colon are unremarkable. Vascular/Lymphatic: Borderline enlarged upper abdominal  lymph nodes. There is a 9 mm node in the celiac axis region, a 10 mm node in the periportal region on image number 23 and a 7.5 mm node near the portal vein on image number 20. The portal and hepatic veins are patent. The aorta and branch vessels are patent. Reproductive: The uterus and ovaries are normal. Other: No pelvic mass or adenopathy. No free pelvic fluid collections. No inguinal mass or adenopathy. No abdominal wall hernia or subcutaneous lesions. Musculoskeletal: No significant bony findings. IMPRESSION: 1. 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 2. Normal common bile duct and normal pancreas. 3. Borderline enlarged periportal lymph nodes suspicious for metastatic disease. 4. No findings suspicious for hepatic metastatic disease. 5. No other significant findings in the abdomen/pelvis. These results were called by telephone at the time of interpretation on 12/11/2016 at 11:48 am to Dr. Thersa Salt , who verbally acknowledged these results. Electronically Signed   By: Marijo Sanes M.D.   On: 12/11/2016 11:48   Ir Biliary Drain Placement With Cholangiogram  Result Date: 12/13/2016 INDICATION:  Biliary obstruction and jaundice from central hepatic Klatskin tumor. The patient presents for percutaneous biliary drainage procedure. EXAM: PERCUTANEOUS INTERNAL/ EXTERNAL BILIARY DRAINAGE CATHETER PLACEMENT WITH CHOLANGIOGRAM MEDICATIONS: 3.375 g IV Zosyn; The antibiotic was administered within an appropriate time frame prior to the initiation of the procedure. ANESTHESIA/SEDATION: Moderate (conscious) sedation was employed during this procedure. A total of Versed 2.0 mg and Fentanyl 100 mcg was administered intravenously. Moderate Sedation Time: 25 minutes. The patient's level of consciousness and vital signs were monitored continuously by radiology nursing throughout the procedure  under my direct supervision. FLUOROSCOPY TIME:  Fluoroscopy Time: 4 minutes and 12 seconds. 36 mGy. COMPLICATIONS: None immediate. PROCEDURE: Informed written consent was obtained from the patient after a thorough discussion of the procedural risks, benefits and alternatives. All questions were addressed. Maximal Sterile Barrier Technique was utilized including caps, mask, sterile gowns, sterile gloves, sterile drape, hand hygiene and skin antiseptic. A timeout was performed prior to the initiation of the procedure. Ultrasound was performed initially to localize the liver and intrahepatic bile ducts. Under direct ultrasound guidance, a 21 gauge needle was advanced into the inferior right lobe of the liver. Puncture of a peripheral right lobe bile duct was performed. After return of bile, contrast injection was performed and cholangiogram images saved. A guidewire was advanced into the biliary tree. A transitional dilator was then placed and advanced centrally. Additional contrast injection was performed at the level of the common bile duct. A 5 French catheter was then advanced over a guidewire. Guidewire access was further advanced into the distal duodenum. The catheter was removed and percutaneous access dilated to 10 [place] over the guidewire. A 10 [place] internal/ external biliary drainage catheter was then advanced over the wire. The distal portion was formed in the duodenum and the guidewire removed. A sample of bile was obtained and sent for cytologic analysis. Catheter positioning was confirmed by fluoroscopy after injection of contrast material. The catheter was connected to a gravity bag and secured at the skin with a Prolene retention suture and adhesive StatLock device. FINDINGS: After access of a peripheral right intrahepatic bile duct, cholangiogram demonstrates irregular central stricture of bile ducts at the confluence of right-sided and left-sided ducts. Left-sided ducts are not well opacified.  Stricture and obstruction extends into the proper hepatic bile duct. The level of biliary obstruction was able to be crossed allowing placement of an internal/external biliary drainage catheter all the way to the level of the duodenum. Sideholes extend from the duodenum up into right-sided intrahepatic bile ducts. Contrast injection after drainage catheter placement again does not reflux left-sided ducts and the left-sided ductal system may be relatively isolated by obstruction. Initial drainage will be performed from the current catheter to determine degree of response of bilirubin to catheter drainage. Ultimately, a separate left-sided biliary drainage catheter may be necessary if there is insufficient diminishment in bilirubin level with corresponding imaging evidence of persistent left-sided intrahepatic biliary ductal dilatation. IMPRESSION: Successful placement of percutaneous internal/ external biliary drainage catheter via right sided hepatic biliary access. Cholangiogram demonstrates high-grade biliary stricture and obstruction centrally within the liver affecting the biliary confluence and proper hepatic duct. A drainage catheter was able to be advanced all the way to the level of the duodenum. Left-sided bile ducts may be relatively isolated by obstruction based on the cholangiogram. Bilirubin level will be followed with current catheter drainage to gravity bag. A sample of bile was sent for cytologic analysis. Electronically Signed   By: Jenness Corner.D.  On: 12/13/2016 13:08    ASSESSMENT: Klatskin's tumor  PLAN:    1. Klatskin's tumor: CT scan results reviewed independently and reported as above highly suspicious for malignancy.  Patient's bilirubin has significantly improved since placement of a percutaneous biliary drain over the weekend.  Patient has an ERCP scheduled for Thursday if necessary.  She also has an appointment with surgery at [facility] on Friday for further evaluation.  Her  CA-19-9 is only mildly elevated at 39, but the remainder of her tumor markers are within normal limits.  Return to clinic in 1 week for laboratory work.  Further follow-up will be arranged after her surgical appointment on Friday.    Approximately 30 minutes was spent in discussion of which greater than 50% was consultation.  Patient expressed understanding and was in agreement with this plan. She also understands that She can call clinic at any time with any questions, concerns, or complaints.   Cancer Staging No matching staging information was found for the patient.  Lloyd Huger, MD   12/17/2016 1:28 PM

## 2016-12-17 NOTE — Telephone Encounter (Signed)
  Oncology Nurse Navigator Documentation Received call from spouse, Karn Pickler, reporting that her appointment in my chart at [facility] is 12/10. We were given appointment of 12/7. I have contacted [facility] to speak with Ms. Worley in scheduling to clarify this discrepancy. She is out of the office until tomorrow afternoon. Message left for her to call me when she gets in. I have notified Mitch with this information. It appears Dr. Dossie Arbour schedule is now full for 12/7. Navigator Location: [facility] (12/17/16 1500)   )Navigator Encounter Type: Telephone (12/17/16 1500)                                                    Time Spent with Patient: 15 (12/17/16 1500)

## 2016-12-17 NOTE — Telephone Encounter (Signed)
[place], thanks.  Keep me posted.

## 2016-12-18 ENCOUNTER — Ambulatory Visit: Admission: RE | Admit: 2016-12-18 | Payer: 59 | Source: Ambulatory Visit | Admitting: Gastroenterology

## 2016-12-18 ENCOUNTER — Telehealth: Payer: Self-pay

## 2016-12-18 ENCOUNTER — Encounter: Admission: RE | Payer: Self-pay | Source: Ambulatory Visit

## 2016-12-18 SURGERY — ERCP, WITH INTERVENTION IF INDICATED
Anesthesia: General

## 2016-12-18 NOTE — Telephone Encounter (Signed)
  Oncology Nurse Navigator Documentation Spoke with Sierra Elliott at [facility] and she is able to reschedule appointment with Dr. Cloyd Stagers for 12/7, arrive at 0730 for 0800 appointment. Sierra Elliott has been notified.  Navigator Location: [facility] (12/18/16 1300)   )Navigator Encounter Type: Telephone;Follow-up Appt (12/18/16 1300) Telephone: Outgoing Call (12/18/16 1300)                                                  Time Spent with Patient: 15 (12/18/16 1300)

## 2016-12-18 NOTE — Telephone Encounter (Signed)
Spoke with Ms. Jiminez. She is aware of negative biopsy. She does not wish to pursue ERCP at this time. Dr. Allen Norris and Dr. Grayland Ormond are okay with this since she is seeing surgery within the week. I will continue to attempt to contact Dr. Dossie Arbour office to expedite appointment to Friday. Oncology Nurse Navigator Documentation  Navigator Location: [facility] (12/17/16 1600)   )Navigator Encounter Type: Telephone (12/17/16 1600) Telephone: Diagnostic Results (12/17/16 1600)                                                  Time Spent with Patient: 15 (12/17/16 1600)

## 2016-12-19 DIAGNOSIS — C240 Malignant neoplasm of extrahepatic bile duct: Secondary | ICD-10-CM | POA: Diagnosis not present

## 2016-12-19 DIAGNOSIS — C221 Intrahepatic bile duct carcinoma: Secondary | ICD-10-CM | POA: Diagnosis not present

## 2016-12-23 ENCOUNTER — Inpatient Hospital Stay: Payer: 59

## 2016-12-23 NOTE — OR Nursing (Signed)
Pt called. She had leaking around biliary tube Sunday. Only time it has happened. No signs of infection. Pt had been instructed to clean daily or everyother day. I instructed her to get dressing supplies at [facility] or other medical supply store. Informed her that tegaderm was adequate but she may choose to use paper tape to reduce skin irritation and supply price. She is to go back to see her physician at [facility] Friday. Encouraged her to call this physician if leaking reoccurs often, since tube has been clamped by this Dr 12/7. She reports she was given spare bag but not instruction when to reconnect.

## 2016-12-26 DIAGNOSIS — R918 Other nonspecific abnormal finding of lung field: Secondary | ICD-10-CM | POA: Diagnosis not present

## 2016-12-26 DIAGNOSIS — C240 Malignant neoplasm of extrahepatic bile duct: Secondary | ICD-10-CM | POA: Diagnosis not present

## 2016-12-26 DIAGNOSIS — C221 Intrahepatic bile duct carcinoma: Secondary | ICD-10-CM | POA: Diagnosis not present

## 2017-01-02 DIAGNOSIS — K831 Obstruction of bile duct: Secondary | ICD-10-CM | POA: Diagnosis not present

## 2017-01-02 DIAGNOSIS — C221 Intrahepatic bile duct carcinoma: Secondary | ICD-10-CM | POA: Diagnosis not present

## 2017-01-13 HISTORY — PX: OTHER SURGICAL HISTORY: SHX169

## 2017-01-14 HISTORY — PX: CHOLECYSTECTOMY: SHX55

## 2017-01-15 DIAGNOSIS — R109 Unspecified abdominal pain: Secondary | ICD-10-CM | POA: Diagnosis not present

## 2017-01-15 DIAGNOSIS — B961 Klebsiella pneumoniae [K. pneumoniae] as the cause of diseases classified elsewhere: Secondary | ICD-10-CM | POA: Diagnosis not present

## 2017-01-15 DIAGNOSIS — R918 Other nonspecific abnormal finding of lung field: Secondary | ICD-10-CM | POA: Diagnosis not present

## 2017-01-15 DIAGNOSIS — R188 Other ascites: Secondary | ICD-10-CM | POA: Diagnosis not present

## 2017-01-15 DIAGNOSIS — G8918 Other acute postprocedural pain: Secondary | ICD-10-CM | POA: Diagnosis not present

## 2017-01-15 DIAGNOSIS — K8309 Other cholangitis: Secondary | ICD-10-CM | POA: Diagnosis not present

## 2017-01-15 DIAGNOSIS — R0902 Hypoxemia: Secondary | ICD-10-CM | POA: Diagnosis not present

## 2017-01-15 DIAGNOSIS — D62 Acute posthemorrhagic anemia: Secondary | ICD-10-CM | POA: Diagnosis not present

## 2017-01-15 DIAGNOSIS — J9383 Other pneumothorax: Secondary | ICD-10-CM | POA: Diagnosis not present

## 2017-01-15 DIAGNOSIS — K651 Peritoneal abscess: Secondary | ICD-10-CM | POA: Diagnosis not present

## 2017-01-15 DIAGNOSIS — R932 Abnormal findings on diagnostic imaging of liver and biliary tract: Secondary | ICD-10-CM | POA: Diagnosis not present

## 2017-01-15 DIAGNOSIS — Z4682 Encounter for fitting and adjustment of non-vascular catheter: Secondary | ICD-10-CM | POA: Diagnosis not present

## 2017-01-15 DIAGNOSIS — J811 Chronic pulmonary edema: Secondary | ICD-10-CM | POA: Diagnosis not present

## 2017-01-15 DIAGNOSIS — F05 Delirium due to known physiological condition: Secondary | ICD-10-CM | POA: Diagnosis not present

## 2017-01-15 DIAGNOSIS — I959 Hypotension, unspecified: Secondary | ICD-10-CM | POA: Diagnosis not present

## 2017-01-15 DIAGNOSIS — J9811 Atelectasis: Secondary | ICD-10-CM | POA: Diagnosis not present

## 2017-01-15 DIAGNOSIS — K831 Obstruction of bile duct: Secondary | ICD-10-CM | POA: Diagnosis not present

## 2017-01-15 DIAGNOSIS — C221 Intrahepatic bile duct carcinoma: Secondary | ICD-10-CM | POA: Diagnosis not present

## 2017-01-15 DIAGNOSIS — C240 Malignant neoplasm of extrahepatic bile duct: Secondary | ICD-10-CM | POA: Diagnosis not present

## 2017-01-15 DIAGNOSIS — R0689 Other abnormalities of breathing: Secondary | ICD-10-CM | POA: Diagnosis not present

## 2017-01-15 DIAGNOSIS — J90 Pleural effusion, not elsewhere classified: Secondary | ICD-10-CM | POA: Diagnosis not present

## 2017-01-15 DIAGNOSIS — F17200 Nicotine dependence, unspecified, uncomplicated: Secondary | ICD-10-CM | POA: Diagnosis not present

## 2017-01-15 DIAGNOSIS — J158 Pneumonia due to other specified bacteria: Secondary | ICD-10-CM | POA: Diagnosis not present

## 2017-01-15 DIAGNOSIS — A419 Sepsis, unspecified organism: Secondary | ICD-10-CM | POA: Diagnosis not present

## 2017-01-15 DIAGNOSIS — Z882 Allergy status to sulfonamides status: Secondary | ICD-10-CM | POA: Diagnosis not present

## 2017-01-20 ENCOUNTER — Other Ambulatory Visit: Payer: Self-pay | Admitting: *Deleted

## 2017-01-20 MED ORDER — LIDOCAINE 5 % EX PTCH
1.00 | MEDICATED_PATCH | CUTANEOUS | Status: DC
Start: 2017-01-21 — End: 2017-01-20

## 2017-01-20 MED ORDER — ONDANSETRON HCL 4 MG/2ML IJ SOLN
4.00 mg | INTRAMUSCULAR | Status: DC
Start: ? — End: 2017-01-20

## 2017-01-20 MED ORDER — GENERIC EXTERNAL MEDICATION
75.00 | Status: DC
Start: ? — End: 2017-01-20

## 2017-01-20 MED ORDER — HYDROMORPHONE HCL 2 MG PO TABS
2.00 mg | ORAL_TABLET | ORAL | Status: DC
Start: ? — End: 2017-01-20

## 2017-01-20 MED ORDER — HEPARIN SODIUM (PORCINE) 5000 UNIT/ML IJ SOLN
5000.00 | INTRAMUSCULAR | Status: DC
Start: 2017-01-29 — End: 2017-01-20

## 2017-01-20 NOTE — Patient Outreach (Signed)
[place] [facility]) Care Management  01/20/2017  Sierra Elliott 04-28-1955 [id number]   Subjective: Telephone call to patient's home number, no answer, left HIPAA compliant voicemail message, and requested call back.    Objective: Per KPN (Knowledge Performance Now, point of care tool) and chart review, patient hospitalized on 01/15/17 for Combined hepatocellular carcinoma and cholangiocarcinoma at [facility]. Evaluation of biliary obstruction clinically most consistent with Klatskin's tumor. Discharge date unknown.  Patient also hospitalized 12/12/16 -12/14/16 for Hyperbilirubinemia and Klatskin's tumor.    [facility] Care Management Transition of care follow up completed on 12/15/16.         Assessment: Received UMR Transition of care referral on 01/15/17.  Transition of care follow up pending patient contact.      Plan: RNCM will call patient for 2nd telephone outreach attempt, transition of care follow up, within 10 business days if no return call.      Tonisha Silvey H. Annia Friendly, BSN, [place] Management [facility] Telephonic CM Phone: [phone] Fax: [phone]

## 2017-01-22 ENCOUNTER — Other Ambulatory Visit: Payer: Self-pay | Admitting: *Deleted

## 2017-01-22 ENCOUNTER — Ambulatory Visit: Payer: Self-pay | Admitting: *Deleted

## 2017-01-22 NOTE — Patient Outreach (Signed)
[place] [facility]) Care Management  01/22/2017  MEKLIT COTTA [birth year] [id number]   Subjective: Telephone call to patient's home number, no answer, left HIPAA compliant voicemail message, and requested call back.      Objective: Per KPN (Knowledge Performance Now, point of care tool) and chart review, patient hospitalized on 01/15/17 for Combined hepatocellular carcinoma and cholangiocarcinoma at [facility]. Evaluation of biliary obstruction clinically most consistent with Klatskin's tumor. Discharge date unknown.  Patient also hospitalized 12/12/16 -12/14/16 for Hyperbilirubinemia and Klatskin's tumor.    [facility] Care Management Transition of care follow up completed on 12/15/16.         Assessment: Received UMR Transition of care referral on 01/15/17.  Transition of care follow up pending patient contact.      Plan: RNCM will call patient for 3rd telephone outreach attempt, transition of care follow up, within 10 business days if no return call.      Mackinzee Roszak H. Annia Friendly, BSN, [place] Management [facility] Telephonic CM Phone: [phone] Fax: [phone]

## 2017-01-23 ENCOUNTER — Encounter: Payer: Self-pay | Admitting: *Deleted

## 2017-01-23 ENCOUNTER — Ambulatory Visit: Payer: Self-pay | Admitting: *Deleted

## 2017-01-23 ENCOUNTER — Other Ambulatory Visit: Payer: Self-pay | Admitting: *Deleted

## 2017-01-23 NOTE — Patient Outreach (Signed)
[place] [facility]) Care Management  01/23/2017  KALAYSIA DEMONBREUN Apr 04, 1955 [id number]   Subjective:Telephone call to patient's home number, no answer, left HIPAA compliant voicemail message, and requested call back.      Objective:Per KPN (Knowledge Performance Now, point of care tool) and chart review,patient hospitalized on 01/15/17 forCombined hepatocellular carcinoma and [facility]angiocarcinomaat [facility]. Evaluationof biliary obstruction clinically most consistent with Klatskin's tumor.Discharge date unknown. Patient also hospitalized 12/12/16 -12/14/16 for HyperbilirubinemiaandKlatskin's tumor. [facility] Care Management Transition of care follow up completed on 12/15/16.      Assessment: Received UMR Transition of care referral on 01/15/17.Transition of care follow up pending patient contact.     Plan:RNCM will send unsuccessful outreach  letter, [facility] pamphlet, and proceed with case closure, within 10 business days if no return call.     Georgean Spainhower H. Annia Friendly, BSN, [place] Management [facility] Telephonic CM Phone: [phone] Fax: [phone]

## 2017-01-25 DIAGNOSIS — K831 Obstruction of bile duct: Secondary | ICD-10-CM | POA: Diagnosis not present

## 2017-01-25 DIAGNOSIS — Z882 Allergy status to sulfonamides status: Secondary | ICD-10-CM | POA: Diagnosis not present

## 2017-01-25 DIAGNOSIS — I959 Hypotension, unspecified: Secondary | ICD-10-CM | POA: Diagnosis not present

## 2017-01-25 DIAGNOSIS — K651 Peritoneal abscess: Secondary | ICD-10-CM | POA: Diagnosis not present

## 2017-01-25 DIAGNOSIS — C240 Malignant neoplasm of extrahepatic bile duct: Secondary | ICD-10-CM | POA: Diagnosis not present

## 2017-01-25 DIAGNOSIS — J811 Chronic pulmonary edema: Secondary | ICD-10-CM | POA: Diagnosis not present

## 2017-01-25 DIAGNOSIS — R0902 Hypoxemia: Secondary | ICD-10-CM | POA: Diagnosis not present

## 2017-01-25 DIAGNOSIS — F17200 Nicotine dependence, unspecified, uncomplicated: Secondary | ICD-10-CM | POA: Diagnosis not present

## 2017-01-25 DIAGNOSIS — A419 Sepsis, unspecified organism: Secondary | ICD-10-CM | POA: Diagnosis not present

## 2017-01-25 DIAGNOSIS — J90 Pleural effusion, not elsewhere classified: Secondary | ICD-10-CM | POA: Diagnosis not present

## 2017-01-25 DIAGNOSIS — C221 Intrahepatic bile duct carcinoma: Secondary | ICD-10-CM | POA: Diagnosis not present

## 2017-01-26 MED ORDER — DEXTROSE 50 % IV SOLN
50.00 | INTRAVENOUS | Status: DC
Start: ? — End: 2017-01-26

## 2017-01-26 MED ORDER — LEVOFLOXACIN IN D5W 500 MG/100ML IV SOLN
500.00 mg | INTRAVENOUS | Status: DC
Start: 2017-01-28 — End: 2017-01-26

## 2017-01-26 MED ORDER — GENERIC EXTERNAL MEDICATION
2.00 | Status: DC
Start: 2017-01-27 — End: 2017-01-26

## 2017-01-26 MED ORDER — LIDOCAINE 5 % EX PTCH
2.00 | MEDICATED_PATCH | CUTANEOUS | Status: DC
Start: 2017-01-30 — End: 2017-01-26

## 2017-01-26 MED ORDER — DOCUSATE SODIUM 100 MG PO CAPS
100.00 mg | ORAL_CAPSULE | ORAL | Status: DC
Start: 2017-01-27 — End: 2017-01-26

## 2017-01-26 MED ORDER — POLYETHYLENE GLYCOL 3350 17 G PO PACK
17.00 | PACK | ORAL | Status: DC
Start: 2017-01-27 — End: 2017-01-26

## 2017-01-26 MED ORDER — GENERIC EXTERNAL MEDICATION
Status: DC
Start: ? — End: 2017-01-26

## 2017-01-26 MED ORDER — IPRATROPIUM BROMIDE 0.02 % IN SOLN
500.00 | RESPIRATORY_TRACT | Status: DC
Start: ? — End: 2017-01-26

## 2017-01-26 MED ORDER — SODIUM CHLORIDE 0.9 % IV SOLN
10.00 | INTRAVENOUS | Status: DC
Start: ? — End: 2017-01-26

## 2017-01-26 MED ORDER — HYDROMORPHONE HCL 1 MG/ML IJ SOLN
0.50 mg | INTRAMUSCULAR | Status: DC
Start: ? — End: 2017-01-26

## 2017-01-27 DIAGNOSIS — F17200 Nicotine dependence, unspecified, uncomplicated: Secondary | ICD-10-CM | POA: Insufficient documentation

## 2017-01-27 MED ORDER — GENERIC EXTERNAL MEDICATION
Status: DC
Start: ? — End: 2017-01-27

## 2017-01-30 MED ORDER — LEVOFLOXACIN 750 MG PO TABS
750.00 mg | ORAL_TABLET | ORAL | Status: DC
Start: 2017-01-30 — End: 2017-01-30

## 2017-01-30 MED ORDER — GENERIC EXTERNAL MEDICATION
Status: DC
Start: ? — End: 2017-01-30

## 2017-02-02 ENCOUNTER — Other Ambulatory Visit: Payer: Self-pay | Admitting: *Deleted

## 2017-02-02 ENCOUNTER — Encounter: Payer: Self-pay | Admitting: *Deleted

## 2017-02-02 NOTE — Patient Outreach (Signed)
Sierra Elliott) Elliott Elliott  02/02/2017  Sierra Elliott 11/22/1955 [id number]   Subjective: Telephone call from patient's home  number, spoke with patient, and HIPAA verified.  Discussed Sierra Elliott Elliott Elliott UMR Transition of Elliott follow up, patient voiced understanding, and is in agreement to follow up.   Patient states she is returning call, recently discharged from [facility] on 01/29/17, doing much better, recovering from surgery, has supportive family / friends that are performing dressing changes as prescribed, and has a follow up appointment with surgeon on 02/06/17.   States she has no medical conditions prior to this hospitalization and illness diagnosis.  States she does not currently have a primary MD because she was never sick and will get one as soon as she gets through initial follow up appointments.  RNCM educated patient on the importance of follow up with primary MD, patient voiced understanding, and states she will follow up.  Patient voices understanding of medical diagnosis, surgery, and treatment plan. Patient states she is able to manage self Elliott and has assistance as needed. States she is accessing the following Sierra Elliott benefits: outpatient pharmacy, hospital indemnity (not chosen), short term disability, and has family medical leave act [employer]) place.   States she and husband are [name].  Patient states she does not have any education material, transition of Elliott, Elliott coordination, disease Elliott, disease monitoring, transportation, community resource, or pharmacy needs at this time.   States she is very appreciative of the follow up and is in agreement to receive Sierra Elliott information.     Objective:Per KPN (Knowledge Performance Now, point of Elliott tool) and chart review,patient hospitalized on 01/15/17 forCombined hepatocellular [facility]. Evaluationof biliary obstruction clinically most  consistent with Klatskin's tumor.Discharge date unknown. Patient also hospitalized 12/12/16 -12/14/16 for HyperbilirubinemiaandKlatskin's tumor. [facility] Elliott Elliott Transition of Elliott follow up completed on 12/15/16.      Assessment: Received UMR Transition of Elliott referral on 01/15/17.Transition of Elliott follow up completed, no Elliott Elliott needs, and will proceed with case closure.      Plan:RNCM will send patient successful outreach letter, Sierra Elliott pamphlet, and magnet. RNCM will send case closure due to follow up completed / no Elliott Elliott needs request to Sierra Elliott at [place] Elliott.     Sierra Elliott H. Annia Friendly, BSN, [facility] Elliott [facility] Telephonic CM Phone: [phone] Fax: [phone]

## 2017-02-06 DIAGNOSIS — K811 Chronic cholecystitis: Secondary | ICD-10-CM | POA: Diagnosis not present

## 2017-02-06 DIAGNOSIS — K831 Obstruction of bile duct: Secondary | ICD-10-CM | POA: Diagnosis not present

## 2017-02-08 NOTE — Progress Notes (Deleted)
[place]  [phone] [phone]) [phone]  ID: Sierra Elliott OB: 05-11-1955  MR#: [id number]  OIN#:[id number]  Patient Care Team: Patient, No Pcp Per as PCP - General (General Practice) Clent Jacks, RN as Registered Nurse  CHIEF COMPLAINT: Klatskin's tumor  INTERVAL HISTORY: Patient returns to clinic today for hospital follow-up and laboratory work.  She had a percutaneous biliary drain placed over the weekend with improvement of her bilirubin.  She continues to have pain.  She otherwise feels well. She has no neurologic complaints.  She denies any recent fevers or illnesses.  She has had a poor appetite, but denies any weight loss.  She has no chest pain, shortness of breath, or cough.  She denies any nausea, vomiting, constipation, or diarrhea.  She has no melena or hematochezia.  She has no urinary complaints.  Patient otherwise feels well and offers no further specific complaints.  REVIEW OF SYSTEMS:   Review of Systems  Constitutional: Negative.  Negative for fever, malaise/fatigue and weight loss.  Respiratory: Negative.  Negative for cough, hemoptysis and shortness of breath.   Cardiovascular: Negative.  Negative for chest pain and leg swelling.  Gastrointestinal: Negative.  Negative for abdominal pain, blood in stool and melena.  Genitourinary: Negative.  Negative for dysuria and flank pain.  Musculoskeletal: Negative.   Skin: Negative.  Negative for rash.       Jaundiced  Neurological: Negative.  Negative for sensory change and weakness.  Psychiatric/Behavioral: The patient is nervous/anxious.     As per HPI. Otherwise, a complete review of systems is negative.  PAST MEDICAL HISTORY: No past medical history on file.  PAST SURGICAL HISTORY: Past Surgical History:  Procedure Laterality Date  . IR BILIARY DRAIN PLACEMENT WITH CHOLANGIOGRAM  12/13/2016    FAMILY HISTORY: Family History  Problem Relation Age of Onset  .  Hypertension Mother   . Thyroid disease Mother   . Diabetes Mother   . Heart failure Mother   . Cancer Father        bone    ADVANCED DIRECTIVES (Y/N):  N  HEALTH MAINTENANCE: Social History   Tobacco Use  . Smoking status: Current Every Day Smoker    Packs/day: 1.00  . Smokeless tobacco: Never Used  Substance Use Topics  . Alcohol use: Yes    Comment: occassionally  . Drug use: No     Colonoscopy:  PAP:  Bone density:  Lipid panel:  Allergies  Allergen Reactions  . Sulfa Antibiotics     Current Outpatient Medications  Medication Sig Dispense Refill  . Ascorbic Acid (VITAMIN C) 1000 MG tablet Take 1,000 mg by mouth daily.    Marland Kitchen CRANBERRY FRUIT PO Take by mouth.    . docusate sodium (COLACE) 100 MG capsule Take 100 mg by mouth 2 (two) times daily.    . fentaNYL (DURAGESIC - DOSED MCG/HR) 25 MCG/HR patch Place 1 patch (25 mcg total) onto the skin every 3 (three) days. (Patient not taking: Reported on 02/02/2017) 10 patch 0  . HYDROmorphone (DILAUDID) 2 MG tablet Take 1 tablet (2 mg total) by mouth every 6 (six) hours as needed for severe pain. 60 tablet 0  . levofloxacin (LEVAQUIN) 750 MG tablet Take 750 mg by mouth.    . ondansetron (ZOFRAN) 4 MG tablet Take 1 tablet (4 mg total) by mouth every 6 (six) hours as needed for nausea. 20 tablet 0  . spironolactone (ALDACTONE) 25 MG tablet Take 25 mg by mouth.  No current facility-administered medications for this visit.     OBJECTIVE: There were no vitals filed for this visit.   There is no height or weight on file to calculate BMI.    ECOG FS:1 - Symptomatic but completely ambulatory  General: Well-developed, well-nourished, no acute distress. Eyes: Pink conjunctiva, mildly icteric sclera. Lungs: Clear to auscultation bilaterally. Heart: Regular rate and rhythm. No rubs, murmurs, or gallops. Abdomen: Soft, nontender, nondistended. No organomegaly noted, normoactive bowel sounds. Musculoskeletal: No edema, cyanosis,  or clubbing. Neuro: Alert, answering all questions appropriately. Cranial nerves grossly intact. Skin: Jaundiced.  No rashes or petechiae noted. Psych: Normal affect.   LAB RESULTS:  Lab Results  Component Value Date   NA 134 (L) 12/16/2016   K 4.4 12/16/2016   CL 98 (L) 12/16/2016   CO2 27 12/16/2016   GLUCOSE 188 (H) 12/16/2016   BUN 14 12/16/2016   CREATININE 0.68 12/16/2016   CALCIUM 9.8 12/16/2016   PROT 7.8 12/16/2016   ALBUMIN 3.8 12/16/2016   AST 137 (H) 12/16/2016   ALT 223 (H) 12/16/2016   ALKPHOS 300 (H) 12/16/2016   BILITOT 7.9 (H) 12/16/2016   GFRNONAA >60 12/16/2016   GFRAA >60 12/16/2016    Lab Results  Component Value Date   WBC 6.5 12/13/2016   NEUTROABS 3.4 12/13/2016   HGB 12.0 12/13/2016   HCT 35.6 12/13/2016   MCV 92.7 12/13/2016   PLT 319 12/13/2016     STUDIES: No results found.  ASSESSMENT: Klatskin's tumor  PLAN:    1. Klatskin's tumor: CT scan results reviewed independently and reported as above highly suspicious for malignancy.  Patient's bilirubin has significantly improved since placement of a percutaneous biliary drain over the weekend.  Patient has an ERCP scheduled for Thursday if necessary.  She also has an appointment with surgery at [facility] on Friday for further evaluation.  Her CA-19-9 is only mildly elevated at 39, but the remainder of her tumor markers are within normal limits.  Return to clinic in 1 week for laboratory work.  Further follow-up will be arranged after her surgical appointment on Friday.    Approximately 30 minutes was spent in discussion of which greater than 50% was consultation.  Patient expressed understanding and was in agreement with this plan. She also understands that She can call clinic at any time with any questions, concerns, or complaints.   Cancer Staging No matching staging information was found for the patient.  Lloyd Huger, MD   02/08/2017 8:52 AM

## 2017-02-11 ENCOUNTER — Ambulatory Visit: Payer: 59 | Admitting: Oncology

## 2017-02-13 DIAGNOSIS — K9289 Other specified diseases of the digestive system: Secondary | ICD-10-CM | POA: Diagnosis not present

## 2017-02-13 DIAGNOSIS — R188 Other ascites: Secondary | ICD-10-CM | POA: Diagnosis not present

## 2017-02-13 DIAGNOSIS — Z882 Allergy status to sulfonamides status: Secondary | ICD-10-CM | POA: Diagnosis not present

## 2017-02-27 DIAGNOSIS — R188 Other ascites: Secondary | ICD-10-CM | POA: Diagnosis not present

## 2017-02-27 DIAGNOSIS — E8770 Fluid overload, unspecified: Secondary | ICD-10-CM | POA: Diagnosis not present

## 2017-02-27 DIAGNOSIS — Z882 Allergy status to sulfonamides status: Secondary | ICD-10-CM | POA: Diagnosis not present

## 2017-02-27 DIAGNOSIS — Z452 Encounter for adjustment and management of vascular access device: Secondary | ICD-10-CM | POA: Diagnosis not present

## 2017-03-13 DIAGNOSIS — K811 Chronic cholecystitis: Secondary | ICD-10-CM | POA: Diagnosis not present

## 2017-03-13 DIAGNOSIS — K831 Obstruction of bile duct: Secondary | ICD-10-CM | POA: Diagnosis not present

## 2017-04-27 ENCOUNTER — Encounter: Payer: Self-pay | Admitting: Internal Medicine

## 2017-04-29 ENCOUNTER — Other Ambulatory Visit: Payer: Self-pay | Admitting: Internal Medicine

## 2017-04-29 ENCOUNTER — Encounter: Payer: Self-pay | Admitting: Internal Medicine

## 2017-04-29 ENCOUNTER — Other Ambulatory Visit
Admission: RE | Admit: 2017-04-29 | Discharge: 2017-04-29 | Disposition: A | Discharge status: Home / Self Care | Payer: 59 | Source: Ambulatory Visit | Attending: Internal Medicine | Admitting: Internal Medicine

## 2017-04-29 ENCOUNTER — Ambulatory Visit (INDEPENDENT_AMBULATORY_CARE_PROVIDER_SITE_OTHER): Payer: 59 | Admitting: Internal Medicine

## 2017-04-29 VITALS — BP 116/72 | HR 63 | Ht 60.0 in | Wt 108.0 lb

## 2017-04-29 DIAGNOSIS — K838 Other specified diseases of biliary tract: Secondary | ICD-10-CM

## 2017-04-29 DIAGNOSIS — Z87891 Personal history of nicotine dependence: Secondary | ICD-10-CM | POA: Diagnosis not present

## 2017-04-29 DIAGNOSIS — F17201 Nicotine dependence, unspecified, in remission: Secondary | ICD-10-CM | POA: Diagnosis not present

## 2017-04-29 DIAGNOSIS — Z0000 Encounter for general adult medical examination without abnormal findings: Secondary | ICD-10-CM | POA: Diagnosis not present

## 2017-04-29 DIAGNOSIS — K831 Obstruction of bile duct: Secondary | ICD-10-CM | POA: Insufficient documentation

## 2017-04-29 LAB — COMPREHENSIVE METABOLIC PANEL
ALBUMIN: 4 g/dL (ref 3.5–5.0)
ALK PHOS: 144 U/L — AB (ref 38–126)
ALT: 22 U/L (ref 14–54)
AST: 40 U/L (ref 15–41)
Anion gap: 8 (ref 5–15)
BILIRUBIN TOTAL: 0.8 mg/dL (ref 0.3–1.2)
BUN: 13 mg/dL (ref 6–20)
CALCIUM: 9.5 mg/dL (ref 8.9–10.3)
CO2: 26 mmol/L (ref 22–32)
CREATININE: 0.53 mg/dL (ref 0.44–1.00)
Chloride: 104 mmol/L (ref 101–111)
GFR calc Af Amer: >60 mL/min (ref >60)
GLUCOSE: 93 mg/dL (ref 65–99)
POTASSIUM: 5.9 mmol/L — AB (ref 3.5–5.1)
Sodium: 138 mmol/L (ref 135–145)
TOTAL PROTEIN: 7.8 g/dL (ref 6.5–8.1)

## 2017-04-29 LAB — CBC WITH DIFFERENTIAL/PLATELET
Basophils Absolute: 0.1 10*3/uL (ref 0–0.1)
Basophils Relative: 2 %
EOS PCT: 1 %
Eosinophils Absolute: 0.1 10*3/uL (ref 0–0.7)
HEMATOCRIT: 37.8 % (ref 35.0–47.0)
Hemoglobin: 12.3 g/dL (ref 12.0–16.0)
LYMPHS ABS: 3.3 10*3/uL (ref 1.0–3.6)
LYMPHS PCT: 47 %
MCH: 28.9 pg (ref 26.0–34.0)
MCHC: 32.6 g/dL (ref 32.0–36.0)
MCV: 88.6 fL (ref 80.0–100.0)
MONO ABS: 0.7 10*3/uL (ref 0.2–0.9)
Monocytes Relative: 9 %
Neutro Abs: 3 10*3/uL (ref 1.4–6.5)
Neutrophils Relative %: 41 %
PLATELETS: 281 10*3/uL (ref 150–440)
RBC: 4.26 MIL/uL (ref 3.80–5.20)
RDW: 16.7 % — ABNORMAL HIGH (ref 11.5–14.5)
WBC: 7.2 10*3/uL (ref 3.6–11.0)

## 2017-04-29 LAB — LIPID PANEL
CHOL/HDL RATIO: 2.1 ratio
CHOLESTEROL: 163 mg/dL (ref 0–200)
HDL: 78 mg/dL (ref >40)
LDL Cholesterol: 77 mg/dL (ref 0–99)
Triglycerides: 42 mg/dL (ref <150)
VLDL: 8 mg/dL (ref 0–40)

## 2017-04-29 LAB — TSH: TSH: 0.914 u[IU]/mL (ref 0.350–4.500)

## 2017-04-29 NOTE — Progress Notes (Signed)
Date:  04/29/2017   Name:  Sierra Elliott   DOB:  [birth year]   MRN:  [id number]   Chief Complaint: Establish Care and Annual Exam (Breast Exam. Labs. ) Sierra Elliott is a 62 y.o. female who presents today for her Complete Annual Exam. She feels well. She reports exercising some as energy allows. She reports she is sleeping fairly well. She denies breast issues.  She has not had a mammogram in many years and does not desire to have one now.  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  She had cholecystectomy, resection of bile ducts and partial hepatectomy.  All pathology was benign.  She is back to work, slowly regaining strength.  She had lost 26 lbs and has only gained back 4 lbs.  Her appetite is decreased due to loss of taste. Otherwise, she feels well other than decreased energy which is improving.  Review of Systems  Constitutional: Negative for chills, fatigue, fever and unexpected weight change.  HENT: Negative for hearing loss.   Eyes: Negative for visual disturbance.  Respiratory: Negative for cough, chest tightness, shortness of breath and wheezing.   Cardiovascular: Negative for chest pain, palpitations and leg swelling.  Gastrointestinal: Negative for abdominal pain, constipation and diarrhea.  Genitourinary: Negative for dyspareunia and hematuria.  Musculoskeletal: Positive for arthralgias (knees). Negative for gait problem.  Skin: Negative for color change and rash.  Allergic/Immunologic: Negative for environmental allergies.  Neurological: Negative for dizziness and headaches.  Hematological: Negative for adenopathy. Does not bruise/bleed easily.  Psychiatric/Behavioral: Negative for sleep disturbance. The patient is not nervous/anxious.     Patient Active Problem List   Diagnosis Date Noted  . Tobacco use disorder, mild, in sustained remission 04/29/2017  . Cholangioma 12/12/2016  . Goals of care,  counseling/discussion 12/12/2016  . Benign obstructive jaundice 12/12/2016    Prior to Admission medications   Medication Sig Start Date End Date Taking? Authorizing Provider  Acetaminophen (TYLENOL) 325 MG CAPS Take by mouth.   Yes [provider]  docusate sodium (COLACE) 100 MG capsule Take 100 mg by mouth 2 (two) times daily.   Yes [provider]    Allergies  Allergen Reactions  . Sulfasalazine Anaphylaxis  . Sulfa Antibiotics     Past Surgical History:  Procedure Laterality Date  . CHOLECYSTECTOMY  01/14/2017  . IR BILIARY DRAIN PLACEMENT WITH CHOLANGIOGRAM  12/13/2016  . PR EXCIS BILE DUCT TUMOR,INTRAHEPATIC  01/2017  . PR PLMT BILE DUCT STENT PRQ NEW ACCESS W/O SEP CATH  01/2017  . PR RESEC LIVER,TOTAL RT LOBECTOMY Right 01/2017    Social History   Tobacco Use  . Smoking status: Former Smoker    Packs/day: 1.00    Types: Cigarettes    Last attempt to quit: 01/15/2017    Years since quitting: 0.2  . Smokeless tobacco: Never Used  Substance Use Topics  . Alcohol use: Yes    Comment: occassionally  . Drug use: No     Medication list has been reviewed and updated.  PHQ 2/9 Scores 04/29/2017  PHQ - 2 Score 0  PHQ- 9 Score 0    Physical Exam  Constitutional: She is oriented to person, place, and time. She appears well-developed and well-nourished. No distress.  HENT:  Head: Normocephalic and atraumatic.  Right Ear: Tympanic membrane and ear canal normal.  Left Ear: Tympanic membrane and ear canal normal.  Nose: Right sinus exhibits no  maxillary sinus tenderness. Left sinus exhibits no maxillary sinus tenderness.  Mouth/Throat: Uvula is midline and oropharynx is clear and moist.  Eyes: Conjunctivae and EOM are normal. Right eye exhibits no discharge. Left eye exhibits no discharge. No scleral icterus.  Neck: Normal range of motion. Carotid bruit is not present. No erythema present. No thyromegaly present.  Cardiovascular: Normal rate, regular  rhythm, normal heart sounds and normal pulses.  Pulmonary/Chest: Effort normal. No respiratory distress. She has no wheezes. Right breast exhibits no mass, no nipple discharge, no skin change and no tenderness. Left breast exhibits no mass, no nipple discharge, no skin change and no tenderness.  Abdominal: Soft. Bowel sounds are normal. There is no hepatosplenomegaly. There is generalized tenderness. There is no rigidity, no guarding and no CVA tenderness.    Musculoskeletal: Normal range of motion.  Lymphadenopathy:    She has no cervical adenopathy.    She has no axillary adenopathy.  Neurological: She is alert and oriented to person, place, and time. She has normal reflexes. No cranial nerve deficit or sensory deficit.  Skin: Skin is warm, dry and intact. No rash noted.  Psychiatric: She has a normal mood and affect. Her speech is normal and behavior is normal. Thought content normal.  Nursing note and vitals reviewed.   BP 116/72   Pulse 63   Ht 5' (1.524 m)   Wt 118 lb (53.5 kg)   SpO2 98%   BMI 23.05 kg/m   Assessment and Plan: 1. Annual physical exam Continue health diet Try to resume exercise Pt declines mammogram at this time - reinforced breast self awareness - CBC with Differential/Platelet - Comprehensive metabolic panel - Lipid panel - TSH  2. Tobacco use disorder, mild, in sustained remission Congratulations on not smoking  3. Benign obstructive jaundice S/p extensive surgery for benign cause Still slightly tender but gradually improving  No orders of the defined types were placed in this encounter.   Partially dictated using Editor, commissioning. Any errors are unintentional.  Halina Maidens, MD [facility]  04/29/2017

## 2017-04-29 NOTE — Patient Instructions (Signed)

## 2017-05-01 ENCOUNTER — Encounter: Payer: Self-pay | Admitting: Internal Medicine

## 2017-05-05 ENCOUNTER — Encounter: Payer: Self-pay | Admitting: Internal Medicine

## 2017-05-05 NOTE — Telephone Encounter (Signed)
Patient mychart question. Please Advise.

## 2017-05-08 ENCOUNTER — Ambulatory Visit: Payer: 59 | Admitting: Internal Medicine

## 2017-09-15 ENCOUNTER — Telehealth: Payer: 59 | Admitting: Family Medicine

## 2017-09-15 DIAGNOSIS — J019 Acute sinusitis, unspecified: Secondary | ICD-10-CM | POA: Diagnosis not present

## 2017-09-15 MED ORDER — AMOXICILLIN-POT CLAVULANATE 875-125 MG PO TABS: 1.0000 | ORAL_TABLET | Freq: Two times a day (BID) | ORAL | 0 refills | Status: DC

## 2017-09-15 NOTE — Progress Notes (Signed)
We are sorry that you are not feeling well.  Here is how we plan to help!  Based on what you have shared with me it looks like you have sinusitis.  Sinusitis is inflammation and infection in the sinus cavities of the head.  Based on your presentation I believe you most likely have Acute Bacterial Sinusitis.  This is an infection caused by bacteria and is treated with antibiotics. I have prescribed Augmentin 875mg/125mg one tablet twice daily with food, for 10 days. You may use an oral decongestant such as Mucinex D or if you have glaucoma or high blood pressure use plain Mucinex. Saline nasal spray help and can safely be used as often as needed for congestion.  If you develop worsening sinus pain, fever or notice severe headache and vision changes, or if symptoms are not better after completion of antibiotic, please schedule an appointment with a health care provider.    Sinus infections are not as easily transmitted as other respiratory infection, however we still recommend that you avoid close contact with loved ones, especially the very young and elderly.  Remember to wash your hands thoroughly throughout the day as this is the number one way to prevent the spread of infection!  Home Care:  Only take medications as instructed by your medical team.  Complete the entire course of an antibiotic.  Do not take these medications with alcohol.  A steam or ultrasonic humidifier can help congestion.  You can place a towel over your head and breathe in the steam from hot water coming from a faucet.  Avoid close contacts especially the very young and the elderly.  Cover your mouth when you cough or sneeze.  Always remember to wash your hands.  Get Help Right Away If:  You develop worsening fever or sinus pain.  You develop a severe head ache or visual changes.  Your symptoms persist after you have completed your treatment plan.  Make sure you  Understand these instructions.  Will watch your  condition.  Will get help right away if you are not doing well or get worse.  Your e-visit answers were reviewed by a board certified advanced clinical practitioner to complete your personal care plan.  Depending on the condition, your plan could have included both over the counter or prescription medications.  If there is a problem please reply  once you have received a response from your provider.  Your safety is important to us.  If you have drug allergies check your prescription carefully.    You can use MyChart to ask questions about today's visit, request a non-urgent call back, or ask for a work or school excuse for 24 hours related to this e-Visit. If it has been greater than 24 hours you will need to follow up with your provider, or enter a new e-Visit to address those concerns.  You will get an e-mail in the next two days asking about your experience.  I hope that your e-visit has been valuable and will speed your recovery. Thank you for using e-visits.   

## 2017-09-16 ENCOUNTER — Ambulatory Visit: Payer: Self-pay | Admitting: Internal Medicine

## 2017-12-24 ENCOUNTER — Telehealth: Payer: Self-pay | Admitting: Family Medicine

## 2017-12-24 ENCOUNTER — Ambulatory Visit
Admission: RE | Admit: 2017-12-24 | Discharge: 2017-12-24 | Disposition: A | Discharge status: Home / Self Care | Payer: 59 | Source: Ambulatory Visit | Attending: Family Medicine | Admitting: Family Medicine

## 2017-12-24 ENCOUNTER — Other Ambulatory Visit
Admission: RE | Admit: 2017-12-24 | Discharge: 2017-12-24 | Disposition: A | Discharge status: Home / Self Care | Payer: 59 | Source: Home / Self Care | Attending: Family Medicine | Admitting: Family Medicine

## 2017-12-24 DIAGNOSIS — K429 Umbilical hernia without obstruction or gangrene: Secondary | ICD-10-CM | POA: Insufficient documentation

## 2017-12-24 DIAGNOSIS — R109 Unspecified abdominal pain: Secondary | ICD-10-CM | POA: Diagnosis not present

## 2017-12-24 LAB — COMPREHENSIVE METABOLIC PANEL
ALK PHOS: 105 U/L (ref 38–126)
ALT: 34 U/L (ref 0–44)
ANION GAP: 8 (ref 5–15)
AST: 36 U/L (ref 15–41)
Albumin: 4.1 g/dL (ref 3.5–5.0)
BUN: 12 mg/dL (ref 8–23)
CO2: 27 mmol/L (ref 22–32)
Calcium: 9.1 mg/dL (ref 8.9–10.3)
Chloride: 103 mmol/L (ref 98–111)
Creatinine, Ser: 0.56 mg/dL (ref 0.44–1.00)
GFR calc Af Amer: >60 mL/min (ref >60)
Glucose, Bld: 91 mg/dL (ref 70–99)
Potassium: 4.2 mmol/L (ref 3.5–5.1)
SODIUM: 138 mmol/L (ref 135–145)
TOTAL PROTEIN: 7.3 g/dL (ref 6.5–8.1)
Total Bilirubin: 1.2 mg/dL (ref 0.3–1.2)

## 2017-12-24 MED ORDER — IOPAMIDOL (ISOVUE-300) INJECTION 61%
100.0000 mL | Freq: Once | INTRAVENOUS | Status: AC | PRN
  Administered 2017-12-24: 100 mL via INTRAVENOUS

## 2017-12-24 NOTE — Telephone Encounter (Signed)
Patient notified of CT results.  [facility]

## 2017-12-24 NOTE — Telephone Encounter (Signed)
Patient with a complicated history (biliary tumor s/p resection). CT to be obtained for surveillance.  [facility]

## 2018-05-06 ENCOUNTER — Encounter: Payer: 59 | Admitting: Internal Medicine

## 2018-05-20 ENCOUNTER — Telehealth: Payer: Self-pay

## 2018-05-20 NOTE — Telephone Encounter (Signed)
Called patient and informed she is overdue for her physical. Sierra Elliott her schedule is crazy right now due to the virus. Said sometimes she works at [employer] and sometimes she is here in [place].  She will get her schedule in by tomorrow morning and will cb to schedule a WORK IN CPE.  Awaiting cb.

## 2018-06-02 ENCOUNTER — Encounter: Payer: Self-pay | Admitting: Internal Medicine

## 2018-06-02 ENCOUNTER — Other Ambulatory Visit: Payer: Self-pay

## 2018-06-02 ENCOUNTER — Other Ambulatory Visit
Admission: RE | Admit: 2018-06-02 | Discharge: 2018-06-02 | Disposition: A | Discharge status: Home / Self Care | Payer: 59 | Attending: Internal Medicine | Admitting: Internal Medicine

## 2018-06-02 ENCOUNTER — Ambulatory Visit (INDEPENDENT_AMBULATORY_CARE_PROVIDER_SITE_OTHER): Payer: 59 | Admitting: Internal Medicine

## 2018-06-02 ENCOUNTER — Other Ambulatory Visit (HOSPITAL_COMMUNITY)
Admission: RE | Admit: 2018-06-02 | Discharge: 2018-06-02 | Disposition: A | Discharge status: Home / Self Care | Payer: 59 | Source: Ambulatory Visit | Attending: Internal Medicine | Admitting: Internal Medicine

## 2018-06-02 VITALS — BP 118/70 | HR 68 | Ht 60.0 in | Wt 128.0 lb

## 2018-06-02 DIAGNOSIS — Z124 Encounter for screening for malignant neoplasm of cervix: Secondary | ICD-10-CM | POA: Insufficient documentation

## 2018-06-02 DIAGNOSIS — Z1159 Encounter for screening for other viral diseases: Secondary | ICD-10-CM

## 2018-06-02 DIAGNOSIS — Z1231 Encounter for screening mammogram for malignant neoplasm of breast: Secondary | ICD-10-CM | POA: Insufficient documentation

## 2018-06-02 DIAGNOSIS — Z1211 Encounter for screening for malignant neoplasm of colon: Secondary | ICD-10-CM | POA: Diagnosis not present

## 2018-06-02 DIAGNOSIS — F17201 Nicotine dependence, unspecified, in remission: Secondary | ICD-10-CM | POA: Diagnosis not present

## 2018-06-02 DIAGNOSIS — Z0000 Encounter for general adult medical examination without abnormal findings: Secondary | ICD-10-CM

## 2018-06-02 LAB — CBC WITH DIFFERENTIAL/PLATELET
Abs Immature Granulocytes: 0.03 10*3/uL (ref 0.00–0.07)
Basophils Absolute: 0.1 10*3/uL (ref 0.0–0.1)
Basophils Relative: 1 %
Eosinophils Absolute: 0.1 10*3/uL (ref 0.0–0.5)
Eosinophils Relative: 1 %
HCT: 39.3 % (ref 36.0–46.0)
Hemoglobin: 12.9 g/dL (ref 12.0–15.0)
Immature Granulocytes: 0 %
Lymphocytes Relative: 33 %
Lymphs Abs: 2.8 10*3/uL (ref 0.7–4.0)
MCH: 31.3 pg (ref 26.0–34.0)
MCHC: 32.8 g/dL (ref 30.0–36.0)
MCV: 95.4 fL (ref 80.0–100.0)
Monocytes Absolute: 0.6 10*3/uL (ref 0.1–1.0)
Monocytes Relative: 7 %
Neutro Abs: 4.8 10*3/uL (ref 1.7–7.7)
Neutrophils Relative %: 58 %
Platelets: 235 10*3/uL (ref 150–400)
RBC: 4.12 MIL/uL (ref 3.87–5.11)
RDW: 14.2 % (ref 11.5–15.5)
WBC: 8.3 10*3/uL (ref 4.0–10.5)
nRBC: 0 % (ref 0.0–0.2)

## 2018-06-02 LAB — POCT URINALYSIS DIPSTICK
Bilirubin, UA: NEGATIVE
Blood, UA: NEGATIVE
Glucose, UA: NEGATIVE
Leukocytes, UA: NEGATIVE
Nitrite, UA: NEGATIVE
Protein, UA: NEGATIVE
Spec Grav, UA: <=1.005 — AB (ref 1.010–1.025)
Urobilinogen, UA: 0.2 E.U./dL
pH, UA: 5 (ref 5.0–8.0)

## 2018-06-02 LAB — COMPREHENSIVE METABOLIC PANEL
ALT: 23 U/L (ref 0–44)
AST: 31 U/L (ref 15–41)
Albumin: 4 g/dL (ref 3.5–5.0)
Alkaline Phosphatase: 78 U/L (ref 38–126)
Anion gap: 7 (ref 5–15)
BUN: 12 mg/dL (ref 8–23)
CO2: 25 mmol/L (ref 22–32)
Calcium: 8.9 mg/dL (ref 8.9–10.3)
Chloride: 105 mmol/L (ref 98–111)
Creatinine, Ser: 0.6 mg/dL (ref 0.44–1.00)
GFR calc Af Amer: >60 mL/min (ref >60)
GFR calc non Af Amer: >60 mL/min (ref >60)
Glucose, Bld: 96 mg/dL (ref 70–99)
Potassium: 4.3 mmol/L (ref 3.5–5.1)
Sodium: 137 mmol/L (ref 135–145)
Total Bilirubin: 1.2 mg/dL (ref 0.3–1.2)
Total Protein: 6.9 g/dL (ref 6.5–8.1)

## 2018-06-02 NOTE — Progress Notes (Signed)
Date:  06/02/2018   Name:  Sierra Elliott   DOB:  10-09-1955   MRN:  [id number]   Chief Complaint: Annual Exam (Breast Exam and Pap. ) Sierra Elliott is a 63 y.o. female who presents today for her Complete Annual Exam. She feels well. She reports exercising walking at work. She reports she is sleeping well. She denies breast issues.  She does not want a mammogram and declines any form of CRC screening.  Her last Pap smear was many years ago.  She is due for Mammogram, colonoscopy, Pap smear, Hep C testing and TDAP.  HPI  Review of Systems  Constitutional: Negative for chills, fatigue and fever.  HENT: Negative for congestion, hearing loss, tinnitus, trouble swallowing and voice change.   Eyes: Negative for visual disturbance.  Respiratory: Negative for cough, chest tightness, shortness of breath and wheezing.   Cardiovascular: Negative for chest pain, palpitations and leg swelling.  Gastrointestinal: Negative for abdominal pain, constipation, diarrhea and vomiting.  Endocrine: Negative for polydipsia and polyuria.  Genitourinary: Negative for dysuria, frequency, genital sores, menstrual problem, vaginal bleeding and vaginal discharge.  Musculoskeletal: Negative for arthralgias, gait problem and joint swelling.  Skin: Negative for color change and rash.  Neurological: Negative for dizziness, tremors, light-headedness and headaches.  Hematological: Negative for adenopathy. Does not bruise/bleed easily.  Psychiatric/Behavioral: Negative for dysphoric mood and sleep disturbance. The patient is not nervous/anxious.     Patient Active Problem List   Diagnosis Date Noted  . Tobacco use disorder, mild, in sustained remission 04/29/2017  . Cholangioma 12/12/2016  . Goals of care, counseling/discussion 12/12/2016  . Benign obstructive jaundice 12/12/2016    Allergies  Allergen Reactions  . Sulfasalazine Anaphylaxis  . Sulfa Antibiotics     Past Surgical History:  Procedure  Laterality Date  . CHOLECYSTECTOMY  01/14/2017  . IR BILIARY DRAIN PLACEMENT WITH CHOLANGIOGRAM  12/13/2016  . PR EXCIS BILE DUCT TUMOR,INTRAHEPATIC  01/2017  . PR PLMT BILE DUCT STENT PRQ NEW ACCESS W/O SEP CATH  01/2017  . PR RESEC LIVER,TOTAL RT LOBECTOMY Right 01/2017    Social History   Tobacco Use  . Smoking status: Former Smoker    Packs/day: 1.00    Years: 20.00    Pack years: 20.00    Types: Cigarettes    Last attempt to quit: 01/15/2017    Years since quitting: 1.3  . Smokeless tobacco: Never Used  Substance Use Topics  . Alcohol use: Yes    Comment: occassionally  . Drug use: No     Medication list has been reviewed and updated.  Current Meds  Medication Sig  . Acetaminophen (TYLENOL) 325 MG CAPS Take by mouth.  . docusate sodium (COLACE) 100 MG capsule Take 100 mg by mouth 2 (two) times daily.    PHQ 2/9 Scores 06/02/2018 04/29/2017  PHQ - 2 Score 0 0  PHQ- 9 Score - 0    BP Readings from Last 3 Encounters:  06/02/18 118/70  04/29/17 116/72  12/16/16 124/75    Physical Exam Vitals signs and nursing note reviewed.  Constitutional:      General: She is not in acute distress.    Appearance: She is well-developed.  HENT:     Head: Normocephalic and atraumatic.     Right Ear: Tympanic membrane and ear canal normal.     Left Ear: Tympanic membrane and ear canal normal.     Nose:     Right Sinus: No maxillary sinus tenderness.  Left Sinus: No maxillary sinus tenderness.     Mouth/Throat:     Pharynx: Uvula midline.  Eyes:     General: No scleral icterus.       Right eye: No discharge.        Left eye: No discharge.     Conjunctiva/sclera: Conjunctivae normal.  Neck:     Musculoskeletal: Normal range of motion. No erythema.     Thyroid: No thyromegaly.     Vascular: No carotid bruit.  Cardiovascular:     Rate and Rhythm: Normal rate and regular rhythm.     Pulses: Normal pulses.     Heart sounds: Normal heart sounds.  Pulmonary:     Effort:  Pulmonary effort is normal. No respiratory distress.     Breath sounds: No wheezing.  Chest:     Breasts:        Right: No mass, nipple discharge, skin change or tenderness.        Left: No mass, nipple discharge, skin change or tenderness.  Abdominal:     General: Bowel sounds are normal.     Palpations: Abdomen is soft.     Tenderness: There is no abdominal tenderness.     Hernia: There is no hernia in the right inguinal area or left inguinal area.  Genitourinary:    Labia:        Right: No tenderness, lesion or injury.        Left: No tenderness, lesion or injury.      Vagina: Normal.     Cervix: Normal.     Uterus: Normal.      Adnexa: Right adnexa normal and left adnexa normal.       Right: No tenderness.         Left: No tenderness.    Musculoskeletal: Normal range of motion.  Lymphadenopathy:     Cervical: No cervical adenopathy.     Lower Body: No right inguinal adenopathy. No left inguinal adenopathy.  Skin:    General: Skin is warm and dry.     Findings: No rash.  Neurological:     Mental Status: She is alert and oriented to person, place, and time.     Cranial Nerves: No cranial nerve deficit.     Sensory: No sensory deficit.     Deep Tendon Reflexes: Reflexes are normal and symmetric.  Psychiatric:        Speech: Speech normal.        Behavior: Behavior normal.        Thought Content: Thought content normal.     Wt Readings from Last 3 Encounters:  06/02/18 128 lb (58.1 kg)  04/29/17 108 lb (49 kg)  12/16/16 118 lb 8 oz (53.8 kg)    BP 118/70   Pulse 68   Ht 5' (1.524 m)   Wt 128 lb (58.1 kg)   SpO2 97%   BMI 25.00 kg/m   Assessment and Plan: 1. Annual physical exam Normal exam Pt declines all CRC screenings - CBC with Differential/Platelet - Comprehensive metabolic panel - Lipid panel - POCT urinalysis dipstick - TSH  2. Encounter for screening mammogram for breast cancer Pt continues to decline mammogram  3. Colon cancer screening Pt  declines screening  4. Need for hepatitis C screening test - Hepatitis C antibody  5. Encounter for Papanicolaou smear for cervical cancer screening Obtained today; if normal, will not need further screenings - Cytology - PAP  6. Tobacco use disorder, severe, in early remission  Pt congratulated on continuing tobacco free   Partially dictated using Editor, commissioning. Any errors are unintentional.  Halina Maidens, MD [facility]  06/02/2018

## 2018-06-03 LAB — LIPID PANEL
Cholesterol: 171 mg/dL (ref 0–200)
HDL: 76 mg/dL (ref >40)
LDL Cholesterol: 85 mg/dL (ref 0–99)
Total CHOL/HDL Ratio: 2.3 RATIO
Triglycerides: 52 mg/dL (ref <150)
VLDL: 10 mg/dL (ref 0–40)

## 2018-06-03 LAB — HEPATITIS C ANTIBODY: HCV Ab: <0.1 s/co ratio (ref 0.0–0.9)

## 2018-06-03 LAB — TSH: TSH: 1.155 u[IU]/mL (ref 0.350–4.500)

## 2018-06-04 LAB — CYTOLOGY - PAP
Adequacy: ABSENT — AB
HPV: DETECTED — AB

## 2018-06-09 ENCOUNTER — Other Ambulatory Visit: Payer: Self-pay | Admitting: Internal Medicine

## 2018-06-09 ENCOUNTER — Encounter: Payer: Self-pay | Admitting: Internal Medicine

## 2018-06-09 DIAGNOSIS — R87612 Low grade squamous intraepithelial lesion on cytologic smear of cervix (LGSIL): Secondary | ICD-10-CM | POA: Insufficient documentation

## 2018-06-09 NOTE — Telephone Encounter (Signed)
Patient was referral to [facility]. Please Advise.

## 2018-06-10 ENCOUNTER — Telehealth: Payer: Self-pay | Admitting: Obstetrics & Gynecology

## 2018-06-10 NOTE — Telephone Encounter (Signed)
[facility] referring for LGSIL on Pap smear of cervix. Called and left voicemail for patient to call back to be schedule

## 2018-06-23 ENCOUNTER — Other Ambulatory Visit (HOSPITAL_COMMUNITY)
Admission: RE | Admit: 2018-06-23 | Discharge: 2018-06-23 | Disposition: A | Discharge status: Home / Self Care | Payer: 59 | Source: Ambulatory Visit | Attending: Obstetrics and Gynecology | Admitting: Obstetrics and Gynecology

## 2018-06-23 ENCOUNTER — Encounter: Payer: Self-pay | Admitting: Obstetrics and Gynecology

## 2018-06-23 ENCOUNTER — Other Ambulatory Visit: Payer: Self-pay

## 2018-06-23 ENCOUNTER — Ambulatory Visit (INDEPENDENT_AMBULATORY_CARE_PROVIDER_SITE_OTHER): Payer: 59 | Admitting: Obstetrics and Gynecology

## 2018-06-23 VITALS — BP 140/90 | Ht 60.0 in | Wt 127.4 lb

## 2018-06-23 DIAGNOSIS — R87612 Low grade squamous intraepithelial lesion on cytologic smear of cervix (LGSIL): Secondary | ICD-10-CM | POA: Diagnosis not present

## 2018-06-23 DIAGNOSIS — R87810 Cervical high risk human papillomavirus (HPV) DNA test positive: Secondary | ICD-10-CM | POA: Insufficient documentation

## 2018-06-23 DIAGNOSIS — N870 Mild cervical dysplasia: Secondary | ICD-10-CM | POA: Diagnosis not present

## 2018-06-23 DIAGNOSIS — N879 Dysplasia of cervix uteri, unspecified: Secondary | ICD-10-CM | POA: Diagnosis not present

## 2018-06-23 NOTE — Progress Notes (Signed)
Obstetrics & Gynecology Office Visit   Chief Complaint:  Chief Complaint  Patient presents with  . Colposcopy  . Vaginal Pain    during intercourse    History of Present Illness:Sierra Elliott is a 63 y.o. woman seen todayat the request of Dr. Halina [facility] at [facility]  for continued surveillance for history of dysplasia. Last pap obtained on LGSIL HPV positive revealed 06/02/2018.  No other paps available for review.  Medical history significant  Suspected cholangiocarcinoma status post resection 01/16/2016 final pathology not showing any evidence of malignancy.  Review of Systems: Review of Systems  Constitutional: Negative.   Genitourinary: Negative.   Skin: Negative.      Past Medical History:  History reviewed. No pertinent past medical history.  Past Surgical History:  Past Surgical History:  Procedure Laterality Date  . CHOLECYSTECTOMY  01/14/2017  . IR BILIARY DRAIN PLACEMENT WITH CHOLANGIOGRAM  12/13/2016  . PR EXCIS BILE DUCT TUMOR,INTRAHEPATIC  01/2017  . PR PLMT BILE DUCT STENT PRQ NEW ACCESS W/O SEP CATH  01/2017  . PR RESEC LIVER,TOTAL RT LOBECTOMY Right 01/2017    Gynecologic History: No LMP recorded. Patient is postmenopausal.  Obstetric History: G1P1001  Family History:  Family History  Problem Relation Age of Onset  . Hypertension Mother   . Thyroid disease Mother   . Diabetes Mother   . Heart failure Mother   . Cancer Father        bone    Social History:  Social History   Socioeconomic History  . Marital status: Married    Spouse name: Not on file  . Number of children: 1  . Years of education: Not on file  . Highest education level: Not on file  Occupational History  . Not on file  Social Needs  . Financial resource strain: Not hard at all  . Food insecurity:    Worry: Never true    Inability: Never true  . Transportation needs:    Medical: No    Non-medical: No  Tobacco Use  . Smoking status: Former Smoker     Packs/day: 1.00    Years: 20.00    Pack years: 20.00    Types: Cigarettes    Last attempt to quit: 01/15/2017    Years since quitting: 1.4  . Smokeless tobacco: Never Used  Substance and Sexual Activity  . Alcohol use: Yes    Comment: occassionally  . Drug use: No  . Sexual activity: Yes    Birth control/protection: Post-menopausal  Lifestyle  . Physical activity:    Days per week: 0 days    Minutes per session: Not on file  . Stress: Not on file  Relationships  . Social connections:    Talks on phone: Not on file    Gets together: Not on file    Attends religious service: Not on file    Active member of club or organization: Not on file    Attends meetings of clubs or organizations: Not on file    Relationship status: Not on file  . Intimate partner violence:    Fear of current or ex partner: Not on file    Emotionally abused: Not on file    Physically abused: Not on file    Forced sexual activity: Not on file  Other Topics Concern  . Not on file  Social History Narrative  . Not on file    Allergies:  Allergies  Allergen Reactions  .  Sulfasalazine Anaphylaxis  . Sulfa Antibiotics     Medications: Prior to Admission medications   Medication Sig Start Date End Date Taking? Authorizing Provider  Acetaminophen (TYLENOL) 325 MG CAPS Take by mouth.   Yes [provider]  docusate sodium (COLACE) 100 MG capsule Take 100 mg by mouth 2 (two) times daily.   Yes [provider]    Physical Exam Vitals: Blood pressure 140/90, height 5' (1.524 m), weight 127 lb 6.4 oz (57.8 kg). No LMP recorded. Patient is postmenopausal.  General: NAD, appears stated age, well nourished HEENT: normocephalic, anicteric Pulmonary: No increased work of breathing Genitourinary:  External: Normal external female genitalia.  Normal urethral meatus, normal Bartholin's and Skene's glands.    Vagina: Normal vaginal mucosa, no evidence of prolapse.    Cervix: Grossly normal  in appearance, no bleeding  Rectal: deferred  Lymphatic: no evidence of inguinal lymphadenopathy Extremities: no edema, erythema, or tenderness Neurologic: Grossly intact Psychiatric: mood appropriate, affect full  Female chaperone present for pelvic and breast  portions of the physical exam  GYNECOLOGY CLINIC COLPOSCOPY PROCEDURE NOTE  63 y.o. G1P1001 here for colposcopy for low-grade squamous intraepithelial neoplasia (LGSIL - encompassing HPV,mild dysplasia,CIN I)  HPV positive pap smear on 06/02/2018. Discussed underlying role for HPV infection in the development of cervical dysplasia, its natural history and progression/regression, need for surveillance.  Is the patient  pregnant: No LMP: No LMP recorded. Patient is postmenopausal. Smoking status:  reports that she quit smoking about 17 months ago. Her smoking use included cigarettes. She has a 20.00 pack-year smoking history. She has never used smokeless tobacco.   Patient given informed consent, signed copy in the chart, time out was performed.  The patient was position in dorsal lithotomy position. Speculum was placed the cervix was visualized.   After application of acetic acid colposcopic inspection of the cervix was undertaken.   Colposcopy adequate, full visualization of transformation zone: Yes no visible lesions; corresponding biopsies obtained.   ECC specimen obtained:  Yes  All specimens were labeled and sent to pathology.   Patient was given post procedure instructions.  Will follow up pathology and manage accordingly.  Routine preventative health maintenance measures emphasized.  OBGyn Exam     Assessment: 63 y.o. G1P1001 follow up for LGSIL HPV positive pap  Plan: Problem List Items Addressed This Visit      Other   LGSIL on Pap smear of cervix - Primary   Relevant Orders   Surgical pathology    Other Visit Diagnoses    Cervical high risk HPV (human papillomavirus) test positive       Relevant Orders    Surgical pathology      - Colposcopy today  - I had a lengthly discussion with Sierra Elliott  regarding the cause of dysplasia of the lower genital tract (including immunosuppression in the setting of HPV exposure and tobacco exposure). I explained the potential for progression to invasive malignancy, the recurrent nature of these lesions (and the need for close continued followup). Results of today's pap will dictate need for further evaluation and follow up per ASCCP guidelines..  - We discussed that 80% of the population will have exposure to human papilloma virus (HPV) during their lifetime.  HPV is a large group of viruses, and are also the causative virus for common warts and genital warts.  The pap smear tests for 13 high risk HPV strains that have some association with cervical cancer, but do not cause visual lesion  such as warts.  HPV type 16 and 18 have the highest association with cervical cancer.  The vast majority of HPV infections will be uncomplicated at clear spontaneously in 12-18 months in non-immunocompromised patients.  Patient with compromised immune systems, those taking immunosuppressive drugs, or smoker have shown to have a lower clearance rate and higher persistence of HPV infection.    Currently there are no FDA approved treatments to promote HPV clearance.  Gardasil vaccination is available to prevent HPV infection, but this is only beneficial pre-exposure.  Abstaining from intercourse will not increase clearance  Lastly we stressed that if properly followed HPV should not lead to cervical cancer.   The goal of screening is to identify patient who develop precancerous lesions of the cervix and treat these prior to progression to frank cervical cancer.  The incidence of cervical cancer is 7 cases per 100,000 women in the [place] a year.  This relatively low rate is in part due to universal screening as well as vaccination efforts.     - She is comfortable with the plan and had  her questions answered. A total of 15 minutes were spent in face-to-face contact with the patient during this encounter with over half of that time devoted to counseling and coordination of care.  - Return in about 1 year (around 06/23/2019) for repeat pap.   Malachy Mood, MD, [facility], [facility] 06/23/2018, 2:17 PM

## 2018-06-23 NOTE — Patient Instructions (Addendum)
   80% of the population will have exposure to human papilloma virus (HPV) during their lifetime.  HPV is a large group of viruses, and are also the causative virus for common warts and genital warts.  The pap smear tests for 13 high risk HPV strains that have some association with cervical cancer, but do not cause visual lesion such as warts.  HPV type 16 and 18 have the highest association with cervical cancer.  The vast majority of HPV infections will be uncomplicated at clear spontaneously in 12-18 months in non-immunocompromised patients.  Patient with compromised immune systems, those taking immunosuppressive drugs, or smoker have shown to have a lower clearance rate and higher persistence of HPV infection.    Currently there are no FDA approved treatments to promote HPV clearance.  Gardasil vaccination is available to prevent HPV infection, but this is only beneficial pre-exposure.  Abstaining from intercourse will not increase clearance  If properly followed HPV should not lead to cervical cancer.   The goal of screening is to identify patient who develop precancerous lesions of the cervix and treat these prior to progression to frank cervical cancer.  The incidence of cervical cancer is 7 cases per 100,000 women in the [place] a year.  This relatively low rate is in part due to universal screening as well as vaccination efforts.

## 2019-04-08 IMAGING — XA IR BILIARY DRAIN PLACEMENT W/ CHOLANGIOGRAM
4 series · 7 of 7 positions shown · non-contrast
Comparison: none

INDICATION: Biliary obstruction and jaundice from central hepatic [place]
tumor. The patient presents for percutaneous biliary drainage
procedure.

[Series 1: fl - angio · 2 of 2 slices shown (1 of 4)]
[im 1/2]
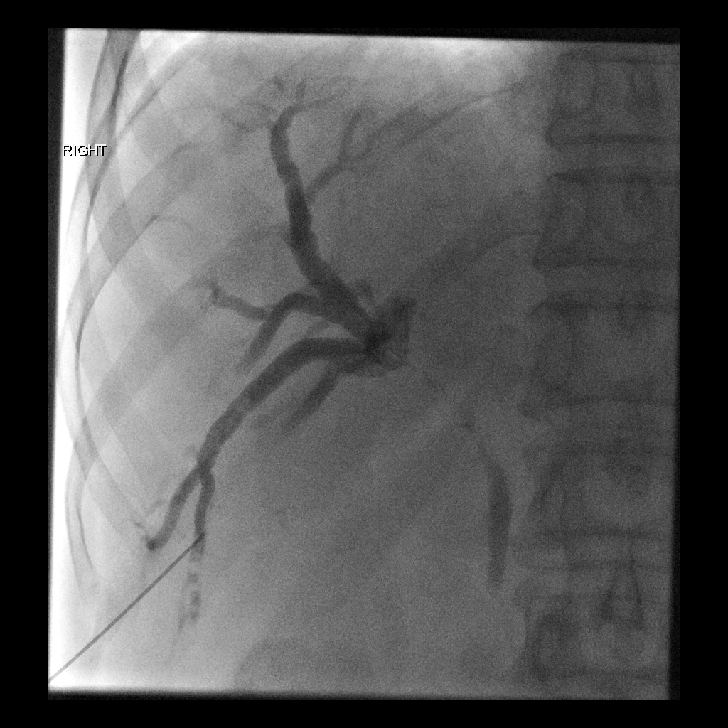
[im 2/2]
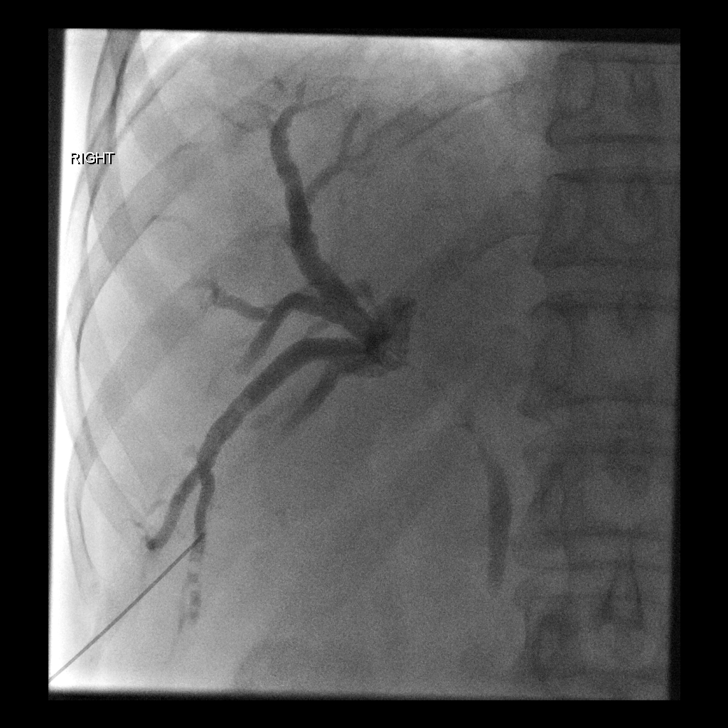

[Series 2: fl - angio · 2 of 2 slices shown (2 of 4)]
[im 1/2]
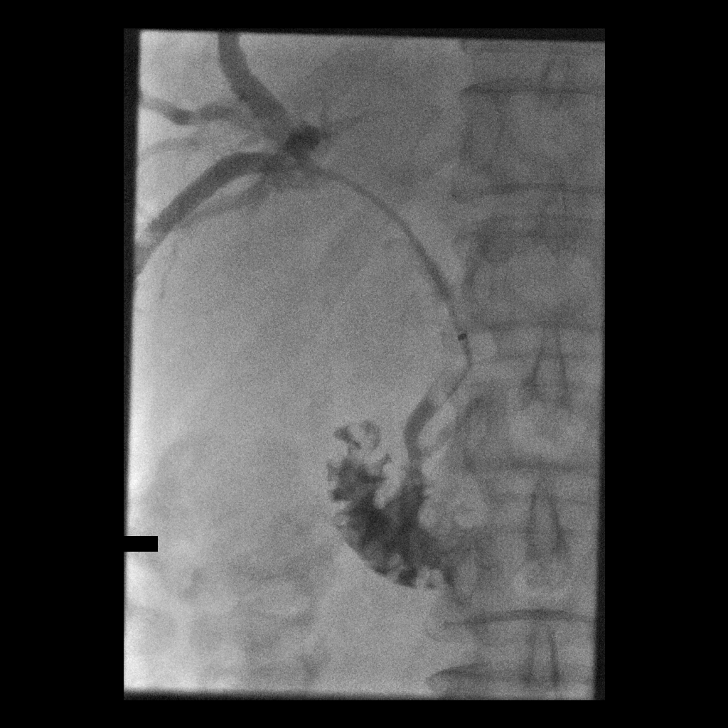
[im 2/2]
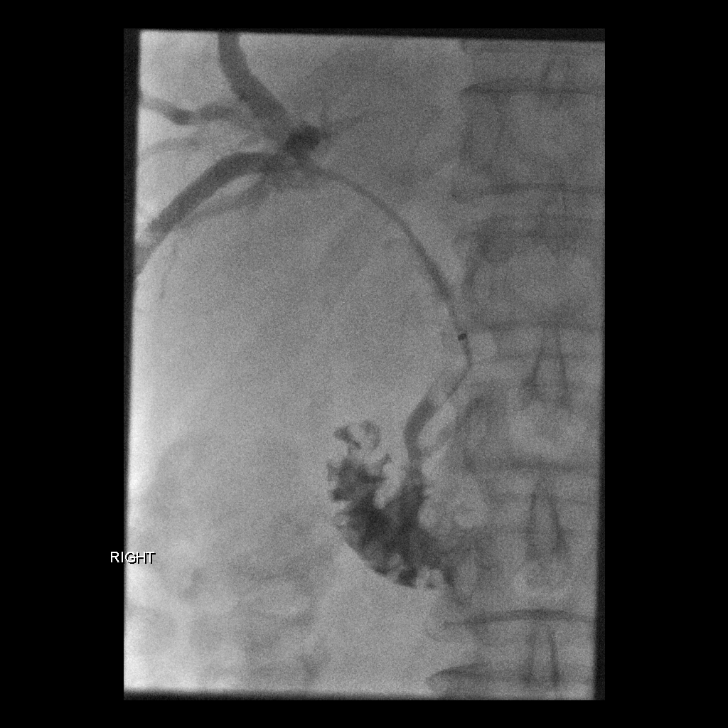

[Series 3: fl - angio · 1 of 1 slices shown (3 of 4)]
[im 1/1]
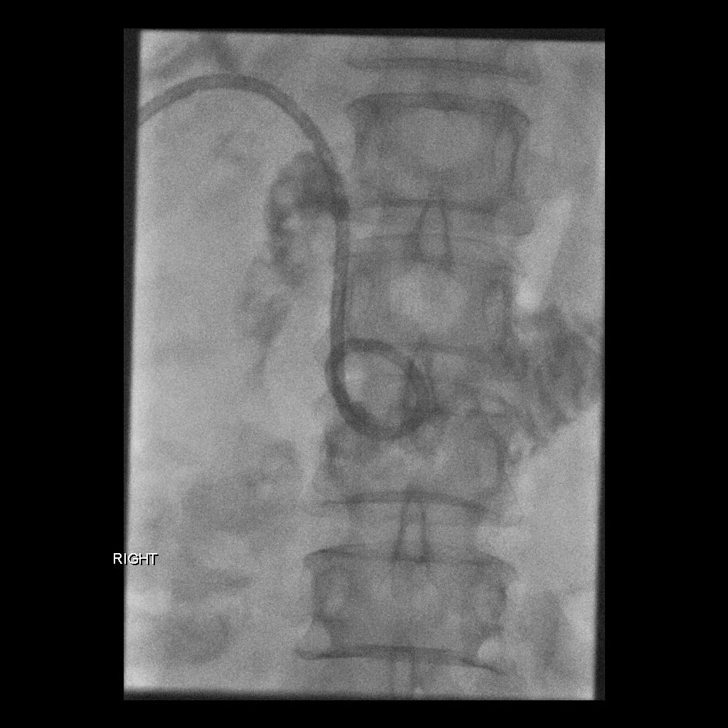

[Series 4: fl - angio · 2 of 2 slices shown (4 of 4)]
[im 1/2]
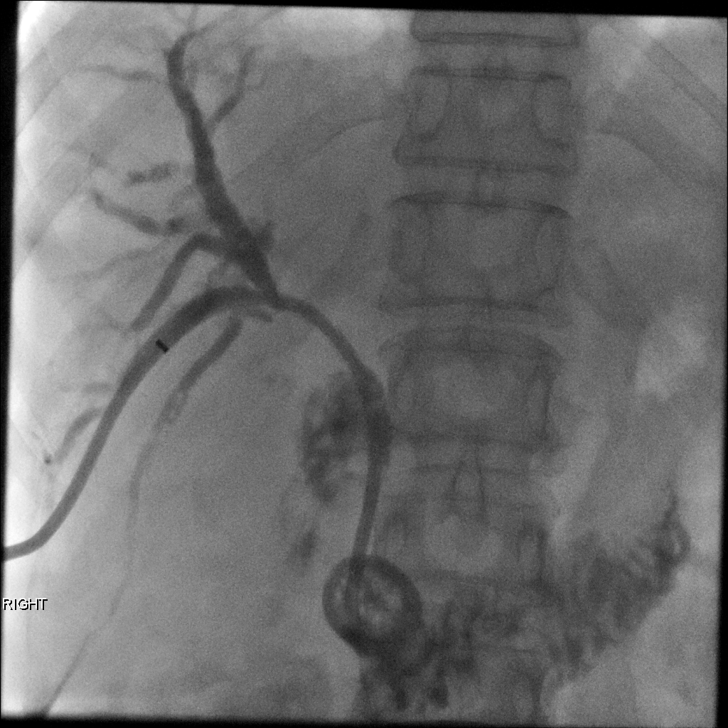
[im 2/2]
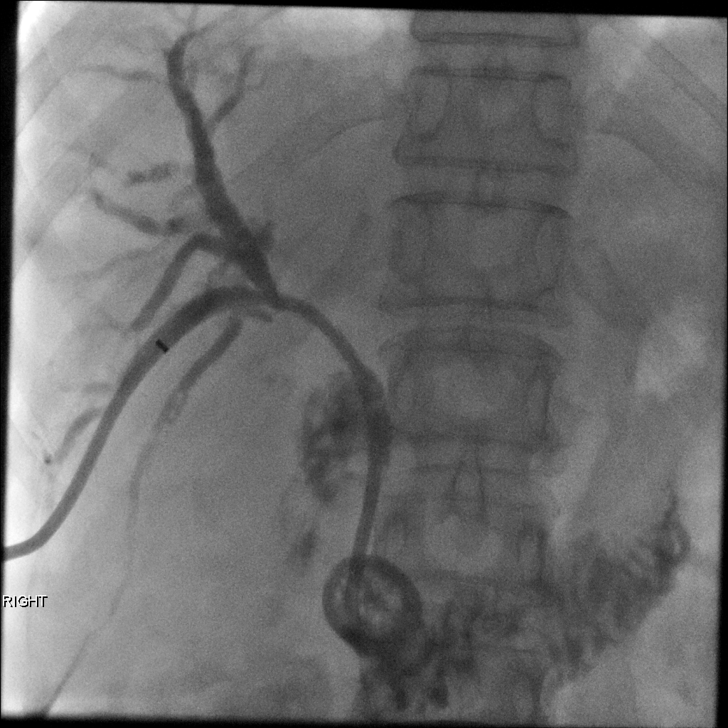

[7 of 7 positions shown; findings below may reference images not displayed]

EXAM:
PERCUTANEOUS INTERNAL/ EXTERNAL BILIARY DRAINAGE CATHETER PLACEMENT
WITH CHOLANGIOGRAM

MEDICATIONS:
3.375 g IV Zosyn; The antibiotic was administered within an
appropriate time frame prior to the initiation of the procedure.

ANESTHESIA/SEDATION:
Moderate (conscious) sedation was employed during this procedure. A
total of Versed 2.0 mg and Fentanyl 100 mcg was administered
intravenously.

Moderate Sedation Time: 25 minutes. The patient's level of
consciousness and vital signs were monitored continuously by
radiology nursing throughout the procedure under my direct
supervision.

FLUOROSCOPY TIME:  Fluoroscopy Time: 4 minutes and 12 seconds. 36
mGy.

COMPLICATIONS:
None immediate.

PROCEDURE:
Informed written consent was obtained from the patient after a
thorough discussion of the procedural risks, benefits and
alternatives. All questions were addressed. Maximal Sterile Barrier
Technique was utilized including caps, mask, sterile gowns, sterile
gloves, sterile drape, hand hygiene and skin antiseptic. A timeout
was performed prior to the initiation of the procedure.

Ultrasound was performed initially to localize the liver and
intrahepatic bile ducts. Under direct ultrasound guidance, a 21
gauge needle was advanced into the inferior right lobe of the liver.
Puncture of a peripheral right lobe bile duct was performed. After
return of bile, contrast injection was performed and cholangiogram
images saved.

A guidewire was advanced into the biliary tree. A transitional
dilator was then placed and advanced centrally. Additional contrast
injection was performed at the level of the common bile duct.

A 5 French catheter was then advanced over a guidewire. Guidewire
access was further advanced into the distal duodenum. The catheter
was removed and percutaneous access dilated to 10 French over the
guidewire. A 10 French internal/ external biliary drainage catheter
was then advanced over the wire. The distal portion was formed in
the duodenum and the guidewire removed. A sample of bile was
obtained and sent for cytologic analysis. Catheter positioning was
confirmed by fluoroscopy after injection of contrast material. The
catheter was connected to a gravity bag and secured at the skin with
a Prolene retention suture and adhesive StatLock device.
FINDINGS: After access of a peripheral right intrahepatic bile duct,
cholangiogram demonstrates irregular central stricture of bile ducts
at the confluence of right-sided and left-sided ducts. Left-sided
ducts are not well opacified. Stricture and obstruction extends into
the proper hepatic bile duct.

The level of biliary obstruction was able to be crossed allowing
placement of an internal/external biliary drainage catheter all the
way to the level of the duodenum. Sideholes extend from the duodenum
up into right-sided intrahepatic bile ducts. Contrast injection
after drainage catheter placement again does not reflux left-sided
ducts and the left-sided ductal system may be relatively isolated by
obstruction. Initial drainage will be performed from the current
catheter to determine degree of response of bilirubin to catheter
drainage. Ultimately, a separate left-sided biliary drainage
catheter may be necessary if there is insufficient diminishment in
bilirubin level with corresponding imaging evidence of persistent
left-sided intrahepatic biliary ductal dilatation.
IMPRESSION: Successful placement of percutaneous internal/ external biliary
drainage catheter via right sided hepatic biliary access.
Cholangiogram demonstrates high-grade biliary stricture and
obstruction centrally within the liver affecting the biliary
confluence and proper hepatic duct. A drainage catheter was able to
be advanced all the way to the level of the duodenum. Left-sided
bile ducts may be relatively isolated by obstruction based on the
cholangiogram. Bilirubin level will be followed with current
catheter drainage to gravity bag. A sample of bile was sent for
cytologic analysis.

## 2019-06-03 ENCOUNTER — Encounter: Payer: 59 | Admitting: Internal Medicine

## 2019-06-09 ENCOUNTER — Ambulatory Visit (INDEPENDENT_AMBULATORY_CARE_PROVIDER_SITE_OTHER): Payer: 59 | Admitting: Internal Medicine

## 2019-06-09 ENCOUNTER — Encounter: Payer: Self-pay | Admitting: Internal Medicine

## 2019-06-09 ENCOUNTER — Other Ambulatory Visit: Payer: Self-pay

## 2019-06-09 ENCOUNTER — Other Ambulatory Visit
Admission: RE | Admit: 2019-06-09 | Discharge: 2019-06-09 | Disposition: A | Discharge status: Home / Self Care | Payer: 59 | Attending: Internal Medicine | Admitting: Internal Medicine

## 2019-06-09 VITALS — BP 132/64 | HR 93 | Temp 97.8°F | Ht 60.0 in | Wt 125.0 lb

## 2019-06-09 DIAGNOSIS — Z1231 Encounter for screening mammogram for malignant neoplasm of breast: Secondary | ICD-10-CM | POA: Diagnosis not present

## 2019-06-09 DIAGNOSIS — Z0000 Encounter for general adult medical examination without abnormal findings: Secondary | ICD-10-CM

## 2019-06-09 DIAGNOSIS — R87612 Low grade squamous intraepithelial lesion on cytologic smear of cervix (LGSIL): Secondary | ICD-10-CM | POA: Diagnosis not present

## 2019-06-09 DIAGNOSIS — Z1211 Encounter for screening for malignant neoplasm of colon: Secondary | ICD-10-CM

## 2019-06-09 LAB — POCT URINALYSIS DIPSTICK
Bilirubin, UA: NEGATIVE
Blood, UA: NEGATIVE
Glucose, UA: NEGATIVE
Ketones, UA: NEGATIVE
Leukocytes, UA: NEGATIVE
Nitrite, UA: NEGATIVE
Protein, UA: NEGATIVE
Spec Grav, UA: 1.015 (ref 1.010–1.025)
Urobilinogen, UA: 0.2 E.U./dL
pH, UA: 6 (ref 5.0–8.0)

## 2019-06-09 LAB — CBC WITH DIFFERENTIAL/PLATELET
Abs Immature Granulocytes: 0.05 10*3/uL (ref 0.00–0.07)
Basophils Absolute: 0.1 10*3/uL (ref 0.0–0.1)
Basophils Relative: 1 %
Eosinophils Absolute: 0.1 10*3/uL (ref 0.0–0.5)
Eosinophils Relative: 1 %
HCT: 41.5 % (ref 36.0–46.0)
Hemoglobin: 13.8 g/dL (ref 12.0–15.0)
Immature Granulocytes: 0 %
Lymphocytes Relative: 25 %
Lymphs Abs: 2.9 10*3/uL (ref 0.7–4.0)
MCH: 30.9 pg (ref 26.0–34.0)
MCHC: 33.3 g/dL (ref 30.0–36.0)
MCV: 93 fL (ref 80.0–100.0)
Monocytes Absolute: 0.8 10*3/uL (ref 0.1–1.0)
Monocytes Relative: 6 %
Neutro Abs: 7.9 10*3/uL — ABNORMAL HIGH (ref 1.7–7.7)
Neutrophils Relative %: 67 %
Platelets: 261 10*3/uL (ref 150–400)
RBC: 4.46 MIL/uL (ref 3.87–5.11)
RDW: 14.3 % (ref 11.5–15.5)
WBC: 11.8 10*3/uL — ABNORMAL HIGH (ref 4.0–10.5)
nRBC: 0 % (ref 0.0–0.2)

## 2019-06-09 LAB — LIPID PANEL
Cholesterol: 188 mg/dL (ref 0–200)
HDL: 73 mg/dL (ref >40)
LDL Cholesterol: 101 mg/dL — ABNORMAL HIGH (ref 0–99)
Total CHOL/HDL Ratio: 2.6 RATIO
Triglycerides: 71 mg/dL (ref <150)
VLDL: 14 mg/dL (ref 0–40)

## 2019-06-09 LAB — COMPREHENSIVE METABOLIC PANEL
ALT: 19 U/L (ref 0–44)
AST: 26 U/L (ref 15–41)
Albumin: 4.4 g/dL (ref 3.5–5.0)
Alkaline Phosphatase: 79 U/L (ref 38–126)
Anion gap: 7 (ref 5–15)
BUN: 10 mg/dL (ref 8–23)
CO2: 27 mmol/L (ref 22–32)
Calcium: 9.3 mg/dL (ref 8.9–10.3)
Chloride: 105 mmol/L (ref 98–111)
Creatinine, Ser: 0.61 mg/dL (ref 0.44–1.00)
GFR calc Af Amer: >60 mL/min (ref >60)
GFR calc non Af Amer: >60 mL/min (ref >60)
Glucose, Bld: 92 mg/dL (ref 70–99)
Potassium: 5.4 mmol/L — ABNORMAL HIGH (ref 3.5–5.1)
Sodium: 139 mmol/L (ref 135–145)
Total Bilirubin: 1.5 mg/dL — ABNORMAL HIGH (ref 0.3–1.2)
Total Protein: 7.2 g/dL (ref 6.5–8.1)

## 2019-06-09 LAB — TSH: TSH: 1.24 u[IU]/mL (ref 0.350–4.500)

## 2019-06-09 NOTE — Progress Notes (Signed)
Date:  06/09/2019   Name:  Sierra Elliott   DOB:  07/09/1955   MRN:  [id number]   Chief Complaint: Annual Exam (Breast Exam. Does she need to see dr. Star Age again?) Sierra Elliott is a 64 y.o. female who presents today for her Complete Annual Exam. She feels well. 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 She reports she is sleeping poorly - no change.   Mammogram - none Pap - CIN1 mild dysplasia  06/2018 rec repeat 12 months Colonoscopy - none Immunization History  Administered Date(s) Administered  . Influenza-Unspecified 10/06/2016  . PFIZER SARS-COV-2 Vaccination 01/25/2019, 02/15/2019    HPI  Lab Results  Component Value Date   CREATININE 0.60 06/02/2018   BUN 12 06/02/2018   NA 137 06/02/2018   K 4.3 06/02/2018   CL 105 06/02/2018   CO2 25 06/02/2018   Lab Results  Component Value Date   CHOL 171 06/02/2018   HDL 76 06/02/2018   LDLCALC 85 06/02/2018   TRIG 52 06/02/2018   CHOLHDL 2.3 06/02/2018   Lab Results  Component Value Date   TSH 1.155 06/02/2018   No results found for: HGBA1C Lab Results  Component Value Date   WBC 8.3 06/02/2018   HGB 12.9 06/02/2018   HCT 39.3 06/02/2018   MCV 95.4 06/02/2018   PLT 235 06/02/2018   Lab Results  Component Value Date   ALT 23 06/02/2018   AST 31 06/02/2018   ALKPHOS 78 06/02/2018   BILITOT 1.2 06/02/2018     Review of Systems  Constitutional: Negative for chills, fatigue and fever.  HENT: Negative for congestion, hearing loss, tinnitus, trouble swallowing and voice change.   Eyes: Negative for visual disturbance.  Respiratory: Negative for cough, chest tightness, shortness of breath and wheezing.   Cardiovascular: Negative for chest pain, palpitations and leg swelling.  Gastrointestinal: Negative for abdominal pain, constipation, diarrhea and vomiting.  Endocrine: Negative for polydipsia and polyuria.  Genitourinary: Negative for dysuria, frequency, genital sores, vaginal bleeding and  vaginal discharge.  Musculoskeletal: Negative for arthralgias, gait problem and joint swelling.  Skin: Negative for color change and rash.  Allergic/Immunologic: Negative for environmental allergies.  Neurological: Negative for dizziness, tremors, light-headedness and headaches.  Hematological: Negative for adenopathy. Does not bruise/bleed easily.  Psychiatric/Behavioral: Negative for dysphoric mood and sleep disturbance. The patient is not nervous/anxious.     Patient Active Problem List   Diagnosis Date Noted  . LGSIL on Pap smear of cervix 06/09/2018  . Tobacco use disorder, mild, in sustained remission 04/29/2017  . Cholangioma 12/12/2016  . Goals of care, counseling/discussion 12/12/2016  . Benign obstructive jaundice 12/12/2016    Allergies  Allergen Reactions  . Sulfasalazine Anaphylaxis  . Sulfa Antibiotics     Past Surgical History:  Procedure Laterality Date  . CHOLECYSTECTOMY  01/14/2017  . IR BILIARY DRAIN PLACEMENT WITH CHOLANGIOGRAM  12/13/2016  . PR EXCIS BILE DUCT TUMOR,INTRAHEPATIC  01/2017  . PR PLMT BILE DUCT STENT PRQ NEW ACCESS W/O SEP CATH  01/2017  . PR RESEC LIVER,TOTAL RT LOBECTOMY Right 01/2017    Social History   Tobacco Use  . Smoking status: Former Smoker    Packs/day: 1.00    Years: 20.00    Pack years: 20.00    Types: Cigarettes    Quit date: 01/15/2017    Years since quitting: 2.3  . Smokeless tobacco: Never Used  Substance Use Topics  . Alcohol use: Yes    Comment: occassionally  .  Drug use: No     Medication list has been reviewed and updated.  Current Meds  Medication Sig  . Acetaminophen (TYLENOL) 325 MG CAPS Take by mouth.    PHQ 2/9 Scores 06/09/2019 06/02/2018 04/29/2017  PHQ - 2 Score 0 0 0  PHQ- 9 Score 0 - 0    BP Readings from Last 3 Encounters:  06/09/19 132/64  06/23/18 140/90  06/02/18 118/70    Physical Exam Vitals and nursing note reviewed.  Constitutional:      General: She is not in acute distress.     Appearance: She is well-developed.  HENT:     Head: Normocephalic and atraumatic.     Right Ear: Tympanic membrane and ear canal normal.     Left Ear: Tympanic membrane and ear canal normal.     Nose:     Right Sinus: No maxillary sinus tenderness.     Left Sinus: No maxillary sinus tenderness.  Eyes:     General: No scleral icterus.       Right eye: No discharge.        Left eye: No discharge.     Conjunctiva/sclera: Conjunctivae normal.  Neck:     Thyroid: No thyromegaly.     Vascular: No carotid bruit.  Cardiovascular:     Rate and Rhythm: Normal rate and regular rhythm.     Pulses: Normal pulses.     Heart sounds: Normal heart sounds.  Pulmonary:     Effort: Pulmonary effort is normal. No respiratory distress.     Breath sounds: No wheezing.  Chest:     Breasts:        Right: No mass, nipple discharge, skin change or tenderness.        Left: No mass, nipple discharge, skin change or tenderness.  Abdominal:     General: Bowel sounds are normal.     Palpations: Abdomen is soft.     Tenderness: There is no abdominal tenderness.  Musculoskeletal:        General: Normal range of motion.     Cervical back: Normal range of motion. No erythema.     Right lower leg: No edema.     Left lower leg: No edema.  Lymphadenopathy:     Cervical: No cervical adenopathy.  Skin:    General: Skin is warm and dry.     Capillary Refill: Capillary refill takes less than 2 seconds.     Findings: No rash.  Neurological:     General: No focal deficit present.     Mental Status: She is alert and oriented to person, place, and time.     Cranial Nerves: No cranial nerve deficit.     Sensory: No sensory deficit.     Deep Tendon Reflexes: Reflexes are normal and symmetric.  Psychiatric:        Mood and Affect: Mood normal.        Speech: Speech normal.        Behavior: Behavior normal.     Wt Readings from Last 3 Encounters:  06/09/19 125 lb (56.7 kg)  06/23/18 127 lb 6.4 oz (57.8 kg)   06/02/18 128 lb (58.1 kg)    BP 132/64   Pulse 93   Temp 97.8 F (36.6 C)   Ht 5' (1.524 m)   Wt 125 lb (56.7 kg)   SpO2 97%   BMI 24.41 kg/m   Assessment and Plan: 1. Annual physical exam Normal exam  Continue healthy diet, regular exercise  Consider shingrix - CBC with Differential/Platelet - Comprehensive metabolic panel - Lipid panel - TSH - POCT urinalysis dipstick  2. Encounter for screening mammogram for breast cancer Pt continues to decline screening Continue self breast exams and annual MD exams  3. Colon cancer screening Monitor for changes in bowel habits, bleeding etc She declines screening at this time  4. LGSIL on Pap smear of cervix Recommend follow up with GYN next month for repeat Pap   Partially dictated using Dragon software. Any errors are unintentional.  Halina Maidens, MD [facility]  06/09/2019

## 2019-06-30 ENCOUNTER — Other Ambulatory Visit (HOSPITAL_COMMUNITY)
Admission: RE | Admit: 2019-06-30 | Discharge: 2019-06-30 | Disposition: A | Discharge status: Home / Self Care | Payer: 59 | Source: Ambulatory Visit | Attending: Obstetrics and Gynecology | Admitting: Obstetrics and Gynecology

## 2019-06-30 ENCOUNTER — Encounter: Payer: Self-pay | Admitting: Obstetrics and Gynecology

## 2019-06-30 ENCOUNTER — Other Ambulatory Visit: Payer: Self-pay

## 2019-06-30 ENCOUNTER — Ambulatory Visit (INDEPENDENT_AMBULATORY_CARE_PROVIDER_SITE_OTHER): Payer: 59 | Admitting: Obstetrics and Gynecology

## 2019-06-30 VITALS — BP 133/87 | HR 76 | Ht 60.0 in | Wt 125.0 lb

## 2019-06-30 DIAGNOSIS — Z01419 Encounter for gynecological examination (general) (routine) without abnormal findings: Secondary | ICD-10-CM | POA: Diagnosis not present

## 2019-06-30 DIAGNOSIS — Z1239 Encounter for other screening for malignant neoplasm of breast: Secondary | ICD-10-CM | POA: Diagnosis not present

## 2019-06-30 DIAGNOSIS — R87612 Low grade squamous intraepithelial lesion on cytologic smear of cervix (LGSIL): Secondary | ICD-10-CM | POA: Diagnosis not present

## 2019-06-30 DIAGNOSIS — Z124 Encounter for screening for malignant neoplasm of cervix: Secondary | ICD-10-CM | POA: Insufficient documentation

## 2019-06-30 NOTE — Progress Notes (Signed)
Gynecology Annual Exam  PCP: Glean Hess, MD  Chief Complaint:  Chief Complaint  Patient presents with   Gynecologic Exam    History of Present Illness:Patient is a 64 y.o. G1P1001 presents for annual exam. The patient has no complaints today.   LMP: No LMP recorded. Patient is postmenopausal. No postmenopausal bleeding  The patient is sexually active. She denies dyspareunia.  The patient does perform self breast exams.  There is no notable family history of breast or ovarian cancer in her family.  The patient wears seatbelts: yes.   The patient has regular exercise: not asked.    The patient denies current symptoms of depression.     Review of Systems: Review of Systems  Constitutional: Negative for chills and fever.  HENT: Negative for congestion.   Respiratory: Negative for cough and shortness of breath.   Cardiovascular: Negative for chest pain and palpitations.  Gastrointestinal: Negative for abdominal pain, constipation, diarrhea, heartburn, nausea and vomiting.  Genitourinary: Negative for dysuria, frequency and urgency.  Skin: Negative for itching and rash.  Neurological: Negative for dizziness and headaches.  Endo/Heme/Allergies: Negative for polydipsia.  Psychiatric/Behavioral: Negative for depression.    Past Medical History:  Patient Active Problem List   Diagnosis Date Noted   LGSIL on Pap smear of cervix 06/09/2018    06/23/2018 Colposcopy CIN I ectocervix 11 O'Clock and negative ECC 06/02/2018 LGSIL HPV positive    Tobacco use disorder, mild, in sustained remission 04/29/2017    Quit 01/2017    Cholangioma 12/12/2016    Surgical excision 01/2017 at [facility] - all pathology benign    Goals of care, counseling/discussion 12/12/2016   Benign obstructive jaundice 12/12/2016    Resolved - has surgical resection of benign gall bladder, bile ducts and partial hepatectomy     Past Surgical History:  Past Surgical History:  Procedure Laterality  Date   CHOLECYSTECTOMY  01/14/2017   IR BILIARY DRAIN PLACEMENT WITH CHOLANGIOGRAM  12/13/2016   PR EXCIS BILE DUCT TUMOR,INTRAHEPATIC  01/2017   PR PLMT BILE DUCT STENT PRQ NEW ACCESS W/O SEP CATH  01/2017   PR RESEC LIVER,TOTAL RT LOBECTOMY Right 01/2017    Gynecologic History:  No LMP recorded. Patient is postmenopausal. Last Pap: Results were:  06/23/2018 Colposcopy CIN I ectocervix 11 O'Clock and negative ECC 06/02/2018 LGSIL HPV positive   Obstetric History: G1P1001  Family History:  Family History  Problem Relation Age of Onset   Hypertension Mother    Thyroid disease Mother    Diabetes Mother    Heart failure Mother    Cancer Father        bone    Social History:  Social History   Socioeconomic History   Marital status: Married    Spouse name: Not on file   Number of children: 1   Years of education: Not on file   Highest education level: Not on file  Occupational History   Not on file  Tobacco Use   Smoking status: Former Smoker    Packs/day: 1.00    Years: 20.00    Pack years: 20.00    Types: Cigarettes    Quit date: 01/15/2017    Years since quitting: 2.4   Smokeless tobacco: Never Used  Vaping Use   Vaping Use: Former  Substance and Sexual Activity   Alcohol use: Yes    Comment: occassionally   Drug use: No   Sexual activity: Yes    Birth control/protection: Post-menopausal  Other Topics  Concern   Not on file  Social History Narrative   Not on file   Social Determinants of Health   Financial Resource Strain:    Difficulty of Paying Living Expenses:   Food Insecurity:    Worried About Charity fundraiser in the Last Year:    Arboriculturist in the Last Year:   Transportation Needs:    Film/video editor (Medical):    Lack of Transportation (Non-Medical):   Physical Activity:    Days of Exercise per Week:    Minutes of Exercise per Session:   Stress:    Feeling of Stress :   Social Connections:     Frequency of Communication with Friends and Family:    Frequency of Social Gatherings with Friends and Family:    Attends Religious Services:    Active Member of Clubs or Organizations:    Attends Archivist Meetings:    Marital Status:   Intimate Partner Violence:    Fear of Current or Ex-Partner:    Emotionally Abused:    Physically Abused:    Sexually Abused:     Allergies:  Allergies  Allergen Reactions   Sulfasalazine Anaphylaxis   Sulfa Antibiotics     Medications: Prior to Admission medications   Medication Sig Start Date End Date Taking? Authorizing Provider  Acetaminophen (TYLENOL) 325 MG CAPS Take by mouth.    [provider]  docusate sodium (COLACE) 100 MG capsule Take 100 mg by mouth 2 (two) times daily.    [provider]    Physical Exam Vitals: Blood pressure 133/87, pulse 76, height 5' (1.524 m), weight 125 lb (56.7 kg).   General: NAD HEENT: normocephalic, anicteric Thyroid: no enlargement, no palpable nodules Pulmonary: No increased work of breathing, CTAB Cardiovascular: RRR, distal pulses 2+ Breast: Breast symmetrical, no tenderness, no palpable nodules or masses, no skin or nipple retraction present, no nipple discharge.  No axillary or supraclavicular lymphadenopathy. Abdomen: NABS, soft, non-tender, non-distended.  Umbilicus without lesions.  No hepatomegaly, splenomegaly or masses palpable. No evidence of hernia  Genitourinary:  External: Normal external female genitalia.  Normal urethral meatus, normal Bartholin's and Skene's glands.    Vagina: Normal vaginal mucosa, no evidence of prolapse.    Cervix: Grossly normal in appearance, no bleeding  Uterus: Non-enlarged, mobile, normal contour.  No CMT  Adnexa: ovaries non-enlarged, no adnexal masses  Rectal: deferred  Lymphatic: no evidence of inguinal lymphadenopathy Extremities: no edema, erythema, or tenderness Neurologic: Grossly intact Psychiatric: mood  appropriate, affect full  Female chaperone present for pelvic and breast  portions of the physical exam     Assessment: 64 y.o. G1P1001 routine annual exam  Plan: Problem List Items Addressed This Visit      Other   LGSIL on Pap smear of cervix    Other Visit Diagnoses    Encounter for gynecological examination without abnormal finding    -  Primary   Screening for malignant neoplasm of cervix       Relevant Orders   Cytology - PAP   Breast screening       Relevant Orders   MS DIGITAL SCREENING TOMO BILATERAL      1) Mammogram - recommend yearly screening mammogram.  Mammogram Was ordered today  2) STI screening  was notoffered and therefore not obtained  3) ASCCP guidelines and rational discussed.  Patient opts for every 3 years screening interval - repeat this year for LGSIL pap HPV pos with CIN  I on colposcopy   4) Osteoporosis  - per USPTF routine screening DEXA at age 53   5) Routine healthcare maintenance including cholesterol, diabetes screening discussed managed by PCP  6) Return in about 1 year (around 06/29/2020) for annual.  Malachy Mood, MD [facility], [facility] 06/30/2019, 8:42 AM

## 2019-06-30 NOTE — Patient Instructions (Signed)
[facility] [address] [place] [address]  [facility]  [address] [place] [address]  Phone: [phone]  

## 2019-07-01 LAB — CYTOLOGY - PAP
Comment: NEGATIVE
Diagnosis: NEGATIVE
High risk HPV: NEGATIVE

## 2021-02-14 ENCOUNTER — Other Ambulatory Visit: Payer: Self-pay

## 2021-02-14 ENCOUNTER — Encounter: Payer: Self-pay | Admitting: Internal Medicine

## 2021-02-14 ENCOUNTER — Ambulatory Visit (INDEPENDENT_AMBULATORY_CARE_PROVIDER_SITE_OTHER): Payer: No Typology Code available for payment source | Admitting: Internal Medicine

## 2021-02-14 ENCOUNTER — Other Ambulatory Visit
Admission: RE | Admit: 2021-02-14 | Discharge: 2021-02-14 | Disposition: A | Discharge status: Home / Self Care | Payer: No Typology Code available for payment source | Attending: Internal Medicine | Admitting: Internal Medicine

## 2021-02-14 VITALS — BP 122/82 | HR 71 | Ht 60.0 in | Wt 123.0 lb

## 2021-02-14 DIAGNOSIS — Z1231 Encounter for screening mammogram for malignant neoplasm of breast: Secondary | ICD-10-CM

## 2021-02-14 DIAGNOSIS — Z23 Encounter for immunization: Secondary | ICD-10-CM

## 2021-02-14 DIAGNOSIS — Z0000 Encounter for general adult medical examination without abnormal findings: Secondary | ICD-10-CM | POA: Diagnosis present

## 2021-02-14 DIAGNOSIS — Z1211 Encounter for screening for malignant neoplasm of colon: Secondary | ICD-10-CM

## 2021-02-14 LAB — COMPREHENSIVE METABOLIC PANEL
ALT: 27 U/L (ref 0–44)
AST: 29 U/L (ref 15–41)
Albumin: 4.3 g/dL (ref 3.5–5.0)
Alkaline Phosphatase: 86 U/L (ref 38–126)
Anion gap: 11 (ref 5–15)
BUN: 12 mg/dL (ref 8–23)
CO2: 26 mmol/L (ref 22–32)
Calcium: 9.3 mg/dL (ref 8.9–10.3)
Chloride: 102 mmol/L (ref 98–111)
Creatinine, Ser: 0.67 mg/dL (ref 0.44–1.00)
GFR, Estimated: >60 mL/min (ref >60)
Glucose, Bld: 98 mg/dL (ref 70–99)
Potassium: 5 mmol/L (ref 3.5–5.1)
Sodium: 139 mmol/L (ref 135–145)
Total Bilirubin: 1.1 mg/dL (ref 0.3–1.2)
Total Protein: 7.3 g/dL (ref 6.5–8.1)

## 2021-02-14 LAB — LIPID PANEL
Cholesterol: 190 mg/dL (ref 0–200)
HDL: 81 mg/dL (ref >40)
LDL Cholesterol: 97 mg/dL (ref 0–99)
Total CHOL/HDL Ratio: 2.3 RATIO
Triglycerides: 59 mg/dL (ref <150)
VLDL: 12 mg/dL (ref 0–40)

## 2021-02-14 LAB — CBC WITH DIFFERENTIAL/PLATELET
Abs Immature Granulocytes: 0.02 10*3/uL (ref 0.00–0.07)
Basophils Absolute: 0.1 10*3/uL (ref 0.0–0.1)
Basophils Relative: 1 %
Eosinophils Absolute: 0.1 10*3/uL (ref 0.0–0.5)
Eosinophils Relative: 1 %
HCT: 41.1 % (ref 36.0–46.0)
Hemoglobin: 13.5 g/dL (ref 12.0–15.0)
Immature Granulocytes: 0 %
Lymphocytes Relative: 33 %
Lymphs Abs: 3 10*3/uL (ref 0.7–4.0)
MCH: 31.1 pg (ref 26.0–34.0)
MCHC: 32.8 g/dL (ref 30.0–36.0)
MCV: 94.7 fL (ref 80.0–100.0)
Monocytes Absolute: 0.7 10*3/uL (ref 0.1–1.0)
Monocytes Relative: 7 %
Neutro Abs: 5.1 10*3/uL (ref 1.7–7.7)
Neutrophils Relative %: 58 %
Platelets: 250 10*3/uL (ref 150–400)
RBC: 4.34 MIL/uL (ref 3.87–5.11)
RDW: 14.1 % (ref 11.5–15.5)
WBC: 8.9 10*3/uL (ref 4.0–10.5)
nRBC: 0 % (ref 0.0–0.2)

## 2021-02-14 LAB — TSH: TSH: 1.854 u[IU]/mL (ref 0.350–4.500)

## 2021-02-14 NOTE — Progress Notes (Signed)
Date:  02/14/2021   Name:  Sierra Elliott   DOB:  [birth year]   MRN:  [id number]   Chief Complaint: Annual Exam (Breast exam no pap ) Sierra Elliott is a 66 y.o. female who presents today for her Complete Annual Exam. She feels well. She reports exercising walk ing 5 days a week. She reports she is sleeping well. Breast complaints none.  Mammogram: none DEXA: none Pap smear: 06/2019 neg with co-testing after CIN 1 in 2020; repeat 2024 Colonoscopy: none  Immunization History  Administered Date(s) Administered   Influenza-Unspecified 10/06/2016, 09/27/2020   PFIZER(Purple Top)SARS-COV-2 Vaccination 01/25/2019, 02/15/2019    HPI  Lab Results  Component Value Date   NA 139 06/09/2019   K 5.4 (H) 06/09/2019   CO2 27 06/09/2019   GLUCOSE 92 06/09/2019   BUN 10 06/09/2019   CREATININE 0.61 06/09/2019   CALCIUM 9.3 06/09/2019   GFRNONAA >60 06/09/2019   Lab Results  Component Value Date   CHOL 188 06/09/2019   HDL 73 06/09/2019   LDLCALC 101 (H) 06/09/2019   TRIG 71 06/09/2019   CHOLHDL 2.6 06/09/2019   Lab Results  Component Value Date   TSH 1.240 06/09/2019   No results found for: HGBA1C Lab Results  Component Value Date   WBC 11.8 (H) 06/09/2019   HGB 13.8 06/09/2019   HCT 41.5 06/09/2019   MCV 93.0 06/09/2019   PLT 261 06/09/2019   Lab Results  Component Value Date   ALT 19 06/09/2019   AST 26 06/09/2019   ALKPHOS 79 06/09/2019   BILITOT 1.5 (H) 06/09/2019   No results found for: 25OHVITD2, 25OHVITD3, VD25OH   Review of Systems  Constitutional:  Negative for chills, fatigue and fever.  HENT:  Negative for congestion, hearing loss, tinnitus, trouble swallowing and voice change.   Eyes:  Negative for visual disturbance.  Respiratory:  Negative for cough, chest tightness, shortness of breath and wheezing.   Cardiovascular:  Negative for chest pain, palpitations and leg swelling.  Gastrointestinal:  Negative for abdominal pain, constipation, diarrhea  and vomiting.  Endocrine: Negative for polydipsia and polyuria.  Genitourinary:  Negative for dysuria, frequency, genital sores, vaginal bleeding and vaginal discharge.  Musculoskeletal:  Negative for arthralgias, gait problem and joint swelling.  Skin:  Negative for color change and rash.  Neurological:  Negative for dizziness, tremors, light-headedness and headaches.  Hematological:  Negative for adenopathy. Does not bruise/bleed easily.  Psychiatric/Behavioral:  Negative for dysphoric mood and sleep disturbance. The patient is not nervous/anxious.    Patient Active Problem List   Diagnosis Date Noted   LGSIL on Pap smear of cervix 06/09/2018   Tobacco use disorder, mild, in sustained remission 04/29/2017   Cholangioma 12/12/2016   Goals of care, counseling/discussion 12/12/2016    Allergies  Allergen Reactions   Sulfasalazine Anaphylaxis   Sulfa Antibiotics     Past Surgical History:  Procedure Laterality Date   CHOLECYSTECTOMY  01/14/2017   IR BILIARY DRAIN PLACEMENT WITH CHOLANGIOGRAM  12/13/2016   PR EXCIS BILE DUCT TUMOR,INTRAHEPATIC  01/2017   PR PLMT BILE DUCT STENT PRQ NEW ACCESS W/O SEP CATH  01/2017   PR RESEC LIVER,TOTAL RT LOBECTOMY Right 01/2017    Social History   Tobacco Use   Smoking status: Former    Packs/day: 1.00    Years: 20.00    Pack years: 20.00    Types: Cigarettes    Quit date: 01/15/2017    Years since quitting: 4.0  Smokeless tobacco: Never  Vaping Use   Vaping Use: Former  Substance Use Topics   Alcohol use: Yes    Comment: occassionally   Drug use: No     Medication list has been reviewed and updated.  Current Meds  Medication Sig   Acetaminophen 325 MG CAPS Take by mouth.   docusate sodium (COLACE) 100 MG capsule Take 100 mg by mouth 2 (two) times daily.    PHQ 2/9 Scores 02/14/2021 06/09/2019 06/02/2018 04/29/2017  PHQ - 2 Score 0 0 0 0  PHQ- 9 Score 0 0 - 0    GAD 7 : Generalized Anxiety Score 02/14/2021 06/09/2019  Nervous,  Anxious, on Edge 0 0  Control/stop worrying 0 0  Worry too much - different things 0 0  Trouble relaxing 0 0  Restless 0 0  Easily annoyed or irritable 0 0  Afraid - awful might happen 0 0  Total GAD 7 Score 0 0  Anxiety Difficulty - Not difficult at all    BP Readings from Last 3 Encounters:  02/14/21 122/82  06/30/19 133/87  06/09/19 132/64    Physical Exam Vitals and nursing note reviewed.  Constitutional:      General: She is not in acute distress.    Appearance: She is well-developed.  HENT:     Head: Normocephalic and atraumatic.     Right Ear: Tympanic membrane and ear canal normal.     Left Ear: Tympanic membrane and ear canal normal.     Nose:     Right Sinus: No maxillary sinus tenderness.     Left Sinus: No maxillary sinus tenderness.  Eyes:     General: No scleral icterus.       Right eye: No discharge.        Left eye: No discharge.     Conjunctiva/sclera: Conjunctivae normal.  Neck:     Thyroid: No thyromegaly.     Vascular: No carotid bruit.  Cardiovascular:     Rate and Rhythm: Normal rate and regular rhythm.     Pulses: Normal pulses.     Heart sounds: Normal heart sounds.  Pulmonary:     Effort: Pulmonary effort is normal. No respiratory distress.     Breath sounds: No wheezing.  Chest:  Breasts:    Right: No mass, nipple discharge, skin change or tenderness.     Left: No mass, nipple discharge, skin change or tenderness.  Abdominal:     General: Bowel sounds are normal.     Palpations: Abdomen is soft.     Tenderness: There is no abdominal tenderness.  Musculoskeletal:     Cervical back: Normal range of motion. No erythema.     Right lower leg: No edema.     Left lower leg: No edema.  Lymphadenopathy:     Cervical: No cervical adenopathy.  Skin:    General: Skin is warm and dry.     Findings: No rash.  Neurological:     Mental Status: She is alert and oriented to person, place, and time.     Cranial Nerves: No cranial nerve deficit.      Sensory: No sensory deficit.     Deep Tendon Reflexes: Reflexes are normal and symmetric.  Psychiatric:        Attention and Perception: Attention normal.        Mood and Affect: Mood normal.    Wt Readings from Last 3 Encounters:  02/14/21 123 lb (55.8 kg)  06/30/19 125 lb (56.7  kg)  06/09/19 125 lb (56.7 kg)    BP 122/82    Pulse 71    Ht 5' (1.524 m)    Wt 123 lb (55.8 kg)    SpO2 98%    BMI 24.02 kg/m   Assessment and Plan: 1. Annual physical exam Normal exam Continue healthy diet and exercise. - CBC with Differential/Platelet - Comprehensive metabolic panel - Lipid panel - TSH  2. Encounter for screening mammogram for breast cancer She declines mammograms She will continue breast self exams  3. Colon cancer screening She declines all screenings  4. Need for shingles vaccine First dose today Return in 3 months for second dose - Varicella-zoster vaccine IM   Partially dictated using Editor, commissioning. Any errors are unintentional.  Halina Maidens, MD [facility]  02/14/2021

## 2021-05-14 ENCOUNTER — Ambulatory Visit: Payer: No Typology Code available for payment source

## 2021-05-15 ENCOUNTER — Ambulatory Visit (INDEPENDENT_AMBULATORY_CARE_PROVIDER_SITE_OTHER): Payer: No Typology Code available for payment source

## 2021-05-15 ENCOUNTER — Ambulatory Visit: Payer: No Typology Code available for payment source | Admitting: Internal Medicine

## 2021-05-15 DIAGNOSIS — Z23 Encounter for immunization: Secondary | ICD-10-CM

## 2022-02-17 ENCOUNTER — Other Ambulatory Visit: Payer: Self-pay

## 2022-02-17 ENCOUNTER — Ambulatory Visit (INDEPENDENT_AMBULATORY_CARE_PROVIDER_SITE_OTHER): Payer: 59 | Admitting: Internal Medicine

## 2022-02-17 ENCOUNTER — Encounter: Payer: Self-pay | Admitting: Internal Medicine

## 2022-02-17 ENCOUNTER — Other Ambulatory Visit
Admission: RE | Admit: 2022-02-17 | Discharge: 2022-02-17 | Disposition: A | Discharge status: Home / Self Care | Payer: 59 | Attending: Internal Medicine | Admitting: Internal Medicine

## 2022-02-17 VITALS — BP 124/76 | HR 79 | Ht 60.0 in | Wt 127.0 lb

## 2022-02-17 DIAGNOSIS — F17201 Nicotine dependence, unspecified, in remission: Secondary | ICD-10-CM

## 2022-02-17 DIAGNOSIS — Z1211 Encounter for screening for malignant neoplasm of colon: Secondary | ICD-10-CM

## 2022-02-17 DIAGNOSIS — J019 Acute sinusitis, unspecified: Secondary | ICD-10-CM | POA: Insufficient documentation

## 2022-02-17 DIAGNOSIS — Z131 Encounter for screening for diabetes mellitus: Secondary | ICD-10-CM | POA: Diagnosis not present

## 2022-02-17 DIAGNOSIS — Z0000 Encounter for general adult medical examination without abnormal findings: Secondary | ICD-10-CM | POA: Insufficient documentation

## 2022-02-17 DIAGNOSIS — Z1231 Encounter for screening mammogram for malignant neoplasm of breast: Secondary | ICD-10-CM

## 2022-02-17 LAB — CBC WITH DIFFERENTIAL/PLATELET
Abs Immature Granulocytes: 0.05 10*3/uL (ref 0.00–0.07)
Basophils Absolute: 0.1 10*3/uL (ref 0.0–0.1)
Basophils Relative: 1 %
Eosinophils Absolute: 0.1 10*3/uL (ref 0.0–0.5)
Eosinophils Relative: 1 %
HCT: 43.4 % (ref 36.0–46.0)
Hemoglobin: 14.3 g/dL (ref 12.0–15.0)
Immature Granulocytes: 1 %
Lymphocytes Relative: 21 %
Lymphs Abs: 2 10*3/uL (ref 0.7–4.0)
MCH: 30.9 pg (ref 26.0–34.0)
MCHC: 32.9 g/dL (ref 30.0–36.0)
MCV: 93.7 fL (ref 80.0–100.0)
Monocytes Absolute: 0.6 10*3/uL (ref 0.1–1.0)
Monocytes Relative: 6 %
Neutro Abs: 7 10*3/uL (ref 1.7–7.7)
Neutrophils Relative %: 70 %
Platelets: 344 10*3/uL (ref 150–400)
RBC: 4.63 MIL/uL (ref 3.87–5.11)
RDW: 14.1 % (ref 11.5–15.5)
WBC: 9.8 10*3/uL (ref 4.0–10.5)
nRBC: 0 % (ref 0.0–0.2)

## 2022-02-17 LAB — COMPREHENSIVE METABOLIC PANEL
ALT: 42 U/L (ref 0–44)
AST: 57 U/L — ABNORMAL HIGH (ref 15–41)
Albumin: 4.1 g/dL (ref 3.5–5.0)
Alkaline Phosphatase: 88 U/L (ref 38–126)
Anion gap: 7 (ref 5–15)
BUN: 13 mg/dL (ref 8–23)
CO2: 26 mmol/L (ref 22–32)
Calcium: 9.1 mg/dL (ref 8.9–10.3)
Chloride: 102 mmol/L (ref 98–111)
Creatinine, Ser: 0.67 mg/dL (ref 0.44–1.00)
GFR, Estimated: >60 mL/min (ref >60)
Glucose, Bld: 84 mg/dL (ref 70–99)
Potassium: 3.9 mmol/L (ref 3.5–5.1)
Sodium: 135 mmol/L (ref 135–145)
Total Bilirubin: 1.3 mg/dL — ABNORMAL HIGH (ref 0.3–1.2)
Total Protein: 7.3 g/dL (ref 6.5–8.1)

## 2022-02-17 LAB — LIPID PANEL
Cholesterol: 189 mg/dL (ref 0–200)
HDL: 69 mg/dL (ref >40)
LDL Cholesterol: 103 mg/dL — ABNORMAL HIGH (ref 0–99)
Total CHOL/HDL Ratio: 2.7 RATIO
Triglycerides: 87 mg/dL (ref <150)
VLDL: 17 mg/dL (ref 0–40)

## 2022-02-17 LAB — HEMOGLOBIN A1C
Hgb A1c MFr Bld: 5.7 % — ABNORMAL HIGH (ref 4.8–5.6)
Mean Plasma Glucose: 116.89 mg/dL

## 2022-02-17 MED ORDER — AZITHROMYCIN 250 MG PO TABS
ORAL_TABLET | ORAL | 0 refills | Status: AC
  Filled 2022-02-17: qty 6, 5d supply, fill #0

## 2022-02-17 NOTE — Progress Notes (Signed)
Date:  02/17/2022   Name:  Sierra Elliott   DOB:  02/05/1955   MRN:  [id number]   Chief Complaint: Annual Exam Sierra Elliott is a 67 y.o. female who presents today for her Complete Annual Exam. She feels fairly well. She reports exercising/walking. She reports she is sleeping poorly. Breast complaints - none.  Mammogram: none DEXA: none Pap smear: 06/2019 neg/neg Colonoscopy: none Hx of tobacco use - 20 pk-yr hx quit 2019 qualifies for LDCT screening  Health Maintenance Due  Topic Date Due   DTaP/Tdap/Td (1 - Tdap) Never done   COLONOSCOPY (Pts 45-32yr Insurance coverage will need to be confirmed)  Never done   Lung Cancer Screening  Never done   MAMMOGRAM  Never done   Pneumonia Vaccine 67 Years old (1 - PCV) Never done   DEXA SCAN  Never done    Immunization History  Administered Date(s) Administered   Influenza-Unspecified 10/06/2016, 09/27/2020, 11/10/2021   PFIZER(Purple Top)SARS-COV-2 Vaccination 01/25/2019, 02/15/2019   Zoster Recombinat (Shingrix) 02/14/2021, 05/15/2021    Sinus Problem This is a chronic problem. The current episode started more than 1 month ago. The problem has been waxing and waning since onset. There has been no fever. Associated symptoms include congestion and sinus pressure. Pertinent negatives include no chills, coughing, headaches or shortness of breath. Treatments tried: mucinex DM.    Lab Results  Component Value Date   NA 139 02/14/2021   K 5.0 02/14/2021   CO2 26 02/14/2021   GLUCOSE 98 02/14/2021   BUN 12 02/14/2021   CREATININE 0.67 02/14/2021   CALCIUM 9.3 02/14/2021   GFRNONAA >60 02/14/2021   Lab Results  Component Value Date   CHOL 190 02/14/2021   HDL 81 02/14/2021   LDLCALC 97 02/14/2021   TRIG 59 02/14/2021   CHOLHDL 2.3 02/14/2021   Lab Results  Component Value Date   TSH 1.854 02/14/2021   No results found for: "HGBA1C" Lab Results  Component Value Date   WBC 8.9 02/14/2021   HGB 13.5 02/14/2021    HCT 41.1 02/14/2021   MCV 94.7 02/14/2021   PLT 250 02/14/2021   Lab Results  Component Value Date   ALT 27 02/14/2021   AST 29 02/14/2021   ALKPHOS 86 02/14/2021   BILITOT 1.1 02/14/2021   No results found for: "25OHVITD2", "25OHVITD3", "VD25OH"   Review of Systems  Constitutional:  Negative for chills, fatigue and fever.  HENT:  Positive for congestion and sinus pressure. Negative for hearing loss, tinnitus, trouble swallowing and voice change.   Eyes:  Negative for visual disturbance.  Respiratory:  Negative for cough, chest tightness, shortness of breath and wheezing.   Cardiovascular:  Negative for chest pain, palpitations and leg swelling.  Gastrointestinal:  Negative for abdominal pain, constipation, diarrhea and vomiting.  Endocrine: Negative for polydipsia and polyuria.  Genitourinary:  Negative for dysuria, frequency, genital sores, vaginal bleeding and vaginal discharge.  Musculoskeletal:  Negative for arthralgias, gait problem and joint swelling.  Skin:  Negative for color change and rash.  Neurological:  Negative for dizziness, tremors, light-headedness and headaches.  Hematological:  Negative for adenopathy. Does not bruise/bleed easily.  Psychiatric/Behavioral:  Negative for dysphoric mood and sleep disturbance. The patient is not nervous/anxious.     Patient Active Problem List   Diagnosis Date Noted   LGSIL on Pap smear of cervix 06/09/2018   Tobacco use disorder, mild, in sustained remission 04/29/2017   Cholangioma 12/12/2016   Goals of care, counseling/discussion  12/12/2016    Allergies  Allergen Reactions   Sulfasalazine Anaphylaxis   Sulfa Antibiotics     Past Surgical History:  Procedure Laterality Date   CHOLECYSTECTOMY  01/14/2017   IR BILIARY DRAIN PLACEMENT WITH CHOLANGIOGRAM  12/13/2016   PR EXCIS BILE DUCT TUMOR,INTRAHEPATIC  01/2017   PR PLMT BILE DUCT STENT PRQ NEW ACCESS W/O SEP CATH  01/2017   PR RESEC LIVER,TOTAL RT LOBECTOMY Right  01/2017    Social History   Tobacco Use   Smoking status: Former    Packs/day: 1.00    Years: 20.00    Total pack years: 20.00    Types: Cigarettes    Quit date: 01/15/2017    Years since quitting: 5.0   Smokeless tobacco: Never  Vaping Use   Vaping Use: Former  Substance Use Topics   Alcohol use: Yes    Comment: occassionally   Drug use: No     Medication list has been reviewed and updated.  Current Meds  Medication Sig   Acetaminophen 325 MG CAPS Take by mouth.   azithromycin (ZITHROMAX Z-PAK) 250 MG tablet Take 2 tablets (500 mg total) by mouth daily for 1 day, THEN 1 tablet (250 mg total) daily for 4 days.   [DISCONTINUED] docusate sodium (COLACE) 100 MG capsule Take 100 mg by mouth 2 (two) times daily.       02/17/2022    8:03 AM 02/14/2021    9:13 AM 06/09/2019    9:51 AM  GAD 7 : Generalized Anxiety Score  Nervous, Anxious, on Edge 0 0 0  Control/stop worrying 0 0 0  Worry too much - different things 0 0 0  Trouble relaxing 0 0 0  Restless 0 0 0  Easily annoyed or irritable 0 0 0  Afraid - awful might happen 0 0 0  Total GAD 7 Score 0 0 0  Anxiety Difficulty Not difficult at all  Not difficult at all       02/17/2022    8:02 AM 02/14/2021    9:12 AM 06/09/2019    9:51 AM  Depression screen PHQ 2/9  Decreased Interest 0 0 0  Down, Depressed, Hopeless 0 0 0  PHQ - 2 Score 0 0 0  Altered sleeping 3 0 0  Tired, decreased energy 0 0 0  Change in appetite 0 0 0  Feeling bad or failure about yourself  0 0 0  Trouble concentrating 0 0 0  Moving slowly or fidgety/restless 0 0 0  Suicidal thoughts 0 0 0  PHQ-9 Score 3 0 0  Difficult doing work/chores Not difficult at all Not difficult at all Not difficult at all    BP Readings from Last 3 Encounters:  02/17/22 124/76  02/14/21 122/82  06/30/19 133/87    Physical Exam Vitals and nursing note reviewed.  Constitutional:      General: She is not in acute distress.    Appearance: She is well-developed.   HENT:     Head: Normocephalic and atraumatic.     Right Ear: Tympanic membrane and ear canal normal.     Left Ear: Tympanic membrane and ear canal normal.     Nose:     Right Sinus: No maxillary sinus tenderness.     Left Sinus: No maxillary sinus tenderness.  Eyes:     General: No scleral icterus.       Right eye: No discharge.        Left eye: No discharge.  Conjunctiva/sclera: Conjunctivae normal.  Neck:     Thyroid: No thyromegaly.     Vascular: No carotid bruit.  Cardiovascular:     Rate and Rhythm: Normal rate and regular rhythm.     Pulses: Normal pulses.     Heart sounds: Normal heart sounds.  Pulmonary:     Effort: Pulmonary effort is normal. No respiratory distress.     Breath sounds: No wheezing.  Chest:  Breasts:    Right: No mass, nipple discharge, skin change or tenderness.     Left: No mass, nipple discharge, skin change or tenderness.  Abdominal:     General: Bowel sounds are normal.     Palpations: Abdomen is soft.     Tenderness: There is no abdominal tenderness.  Musculoskeletal:     Cervical back: Normal range of motion. No erythema.     Right lower leg: No edema.     Left lower leg: No edema.  Lymphadenopathy:     Cervical: No cervical adenopathy.  Skin:    General: Skin is warm and dry.     Findings: No rash.  Neurological:     Mental Status: She is alert and oriented to person, place, and time.     Cranial Nerves: No cranial nerve deficit.     Sensory: No sensory deficit.     Deep Tendon Reflexes: Reflexes are normal and symmetric.  Psychiatric:        Attention and Perception: Attention normal.        Mood and Affect: Mood normal.     Wt Readings from Last 3 Encounters:  02/17/22 127 lb (57.6 kg)  02/14/21 123 lb (55.8 kg)  06/30/19 125 lb (56.7 kg)    BP 124/76   Pulse 79   Ht 5' (1.524 m)   Wt 127 lb (57.6 kg)   SpO2 98%   BMI 24.80 kg/m   Assessment and Plan: Problem List Items Addressed This Visit       Other    Tobacco use disorder, mild, in sustained remission   Relevant Orders   Ambulatory Referral for Lung Cancer Scre   Other Visit Diagnoses     Annual physical exam    -  Primary   Relevant Orders   Comprehensive metabolic panel   Hemoglobin A1c   Lipid panel   CBC with Differential/Platelet   Encounter for screening mammogram for breast cancer       declines mammograms will continue self exams   Colon cancer screening       declines any screening   Screening for diabetes mellitus       Relevant Orders   Hemoglobin A1c   Subacute sinusitis, unspecified location       Relevant Medications   azithromycin (ZITHROMAX Z-PAK) 250 MG tablet   Other Relevant Orders   CBC with Differential/Platelet        Partially dictated using Editor, commissioning. Any errors are unintentional.  Halina Maidens, MD [facility]  02/17/2022

## 2022-07-01 ENCOUNTER — Other Ambulatory Visit: Payer: Self-pay

## 2022-08-01 ENCOUNTER — Encounter: Payer: Self-pay | Admitting: Internal Medicine

## 2023-03-12 ENCOUNTER — Encounter: Payer: Self-pay | Admitting: *Deleted

## 2024-01-20 ENCOUNTER — Telehealth: Payer: Self-pay | Admitting: Internal Medicine

## 2024-01-20 NOTE — Telephone Encounter (Signed)
 Spoke with pt and schedule her for a new pt appt on 03/03/2024.

## 2024-01-20 NOTE — Telephone Encounter (Signed)
 Copied from CRM #[id number]. Topic: Appointments - Scheduling Inquiry for Clinic >> Jan 20, 2024  9:28 AM Eva FALCON wrote: Reason for CRM: Pt states Dr. Tullo would take her as a new patient since she sees her husband. Unable to schedule. Please call to set up appointment.

## 2024-03-03 ENCOUNTER — Ambulatory Visit: Admitting: Internal Medicine
